# Patient Record
Sex: Female | Born: 1985 | Race: White | Hispanic: Yes | Smoking: Never smoker
Health system: Southern US, Community
[De-identification: ages and names within clinical notes are randomized; demographics above are authoritative.]

## PROBLEM LIST (undated history)

## (undated) DIAGNOSIS — Z8632 Personal history of gestational diabetes: Secondary | ICD-10-CM

## (undated) DIAGNOSIS — O24419 Gestational diabetes mellitus in pregnancy, unspecified control: Secondary | ICD-10-CM

## (undated) HISTORY — DX: Personal history of gestational diabetes: Z86.32

---
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
## 2003-01-07 ENCOUNTER — Encounter: Admission: RE | Admit: 2003-01-07 | Discharge: 2003-01-07 | Payer: Self-pay | Admitting: Family Medicine

## 2003-01-14 ENCOUNTER — Encounter: Admission: RE | Admit: 2003-01-14 | Discharge: 2003-01-14 | Payer: Self-pay | Admitting: Family Medicine

## 2003-01-20 ENCOUNTER — Inpatient Hospital Stay (HOSPITAL_COMMUNITY): Admission: AD | Admit: 2003-01-20 | Discharge: 2003-01-23 | Payer: Self-pay | Admitting: *Deleted

## 2003-01-24 ENCOUNTER — Encounter: Admission: RE | Admit: 2003-01-24 | Discharge: 2003-02-23 | Payer: Self-pay | Admitting: *Deleted

## 2003-03-26 ENCOUNTER — Encounter: Admission: RE | Admit: 2003-03-26 | Discharge: 2003-04-25 | Payer: Self-pay | Admitting: *Deleted

## 2003-05-26 ENCOUNTER — Encounter: Admission: RE | Admit: 2003-05-26 | Discharge: 2003-06-25 | Payer: Self-pay | Admitting: *Deleted

## 2003-06-26 ENCOUNTER — Encounter: Admission: RE | Admit: 2003-06-26 | Discharge: 2003-07-26 | Payer: Self-pay | Admitting: *Deleted

## 2003-09-26 ENCOUNTER — Encounter: Admission: RE | Admit: 2003-09-26 | Discharge: 2003-09-26 | Payer: Self-pay | Admitting: Family Medicine

## 2004-01-30 ENCOUNTER — Ambulatory Visit: Payer: Self-pay | Admitting: Family Medicine

## 2004-02-09 ENCOUNTER — Ambulatory Visit: Payer: Self-pay | Admitting: Family Medicine

## 2004-04-05 ENCOUNTER — Ambulatory Visit: Payer: Self-pay | Admitting: Family Medicine

## 2006-04-17 ENCOUNTER — Ambulatory Visit: Payer: Self-pay | Admitting: Family Medicine

## 2006-04-25 ENCOUNTER — Ambulatory Visit: Payer: Self-pay | Admitting: Family Medicine

## 2006-04-28 ENCOUNTER — Ambulatory Visit (HOSPITAL_COMMUNITY): Admission: RE | Admit: 2006-04-28 | Discharge: 2006-04-28 | Payer: Self-pay | Admitting: Obstetrics & Gynecology

## 2006-05-26 ENCOUNTER — Ambulatory Visit (HOSPITAL_COMMUNITY): Admission: RE | Admit: 2006-05-26 | Discharge: 2006-05-26 | Payer: Self-pay | Admitting: Family Medicine

## 2006-05-27 ENCOUNTER — Ambulatory Visit: Payer: Self-pay | Admitting: Family Medicine

## 2006-07-01 ENCOUNTER — Ambulatory Visit: Payer: Self-pay | Admitting: Family Medicine

## 2006-07-04 ENCOUNTER — Encounter (INDEPENDENT_AMBULATORY_CARE_PROVIDER_SITE_OTHER): Payer: Self-pay | Admitting: *Deleted

## 2006-08-15 ENCOUNTER — Ambulatory Visit: Payer: Self-pay | Admitting: Family Medicine

## 2006-08-18 ENCOUNTER — Ambulatory Visit: Payer: Self-pay | Admitting: Family Medicine

## 2006-08-18 ENCOUNTER — Encounter (INDEPENDENT_AMBULATORY_CARE_PROVIDER_SITE_OTHER): Payer: Self-pay | Admitting: Family Medicine

## 2006-08-18 DIAGNOSIS — O99810 Abnormal glucose complicating pregnancy: Secondary | ICD-10-CM

## 2006-09-10 ENCOUNTER — Encounter: Admission: RE | Admit: 2006-09-10 | Discharge: 2006-09-17 | Payer: Self-pay | Admitting: Family Medicine

## 2006-09-16 ENCOUNTER — Ambulatory Visit: Payer: Self-pay | Admitting: Family Medicine

## 2006-09-17 ENCOUNTER — Encounter (INDEPENDENT_AMBULATORY_CARE_PROVIDER_SITE_OTHER): Payer: Self-pay | Admitting: Family Medicine

## 2006-09-18 ENCOUNTER — Telehealth (INDEPENDENT_AMBULATORY_CARE_PROVIDER_SITE_OTHER): Payer: Self-pay | Admitting: *Deleted

## 2006-10-01 ENCOUNTER — Encounter (INDEPENDENT_AMBULATORY_CARE_PROVIDER_SITE_OTHER): Payer: Self-pay | Admitting: Family Medicine

## 2006-10-01 ENCOUNTER — Ambulatory Visit: Payer: Self-pay | Admitting: Sports Medicine

## 2006-10-17 ENCOUNTER — Ambulatory Visit: Payer: Self-pay | Admitting: Family Medicine

## 2006-10-24 ENCOUNTER — Ambulatory Visit: Payer: Self-pay | Admitting: Family Medicine

## 2006-10-24 ENCOUNTER — Encounter (INDEPENDENT_AMBULATORY_CARE_PROVIDER_SITE_OTHER): Payer: Self-pay | Admitting: Family Medicine

## 2006-10-24 LAB — CONVERTED CEMR LAB
Alkaline Phosphatase: 235 units/L — ABNORMAL HIGH (ref 39–117)
BUN: 11 mg/dL (ref 6–23)
Glucose, Bld: 66 mg/dL — ABNORMAL LOW (ref 70–99)
Total Bilirubin: 0.5 mg/dL (ref 0.3–1.2)

## 2006-10-27 ENCOUNTER — Ambulatory Visit: Payer: Self-pay | Admitting: Sports Medicine

## 2006-10-28 ENCOUNTER — Ambulatory Visit: Payer: Self-pay | Admitting: Family Medicine

## 2006-10-29 ENCOUNTER — Inpatient Hospital Stay (HOSPITAL_COMMUNITY): Admission: AD | Admit: 2006-10-29 | Discharge: 2006-10-30 | Payer: Self-pay | Admitting: Obstetrics and Gynecology

## 2006-10-29 ENCOUNTER — Ambulatory Visit: Payer: Self-pay | Admitting: Obstetrics & Gynecology

## 2006-12-08 ENCOUNTER — Ambulatory Visit: Payer: Self-pay | Admitting: Sports Medicine

## 2006-12-08 ENCOUNTER — Encounter (INDEPENDENT_AMBULATORY_CARE_PROVIDER_SITE_OTHER): Payer: Self-pay | Admitting: Family Medicine

## 2006-12-08 LAB — CONVERTED CEMR LAB
BUN: 15 mg/dL (ref 6–23)
Chloride: 106 meq/L (ref 96–112)
Glucose, Bld: 87 mg/dL (ref 70–99)
Potassium: 3.8 meq/L (ref 3.5–5.3)

## 2007-01-07 ENCOUNTER — Ambulatory Visit: Payer: Self-pay | Admitting: Family Medicine

## 2007-02-04 ENCOUNTER — Ambulatory Visit: Payer: Self-pay | Admitting: Family Medicine

## 2007-02-04 ENCOUNTER — Encounter (INDEPENDENT_AMBULATORY_CARE_PROVIDER_SITE_OTHER): Payer: Self-pay | Admitting: Family Medicine

## 2007-02-04 LAB — CONVERTED CEMR LAB: Blood Glucose, Fingerstick: 111

## 2007-02-20 ENCOUNTER — Ambulatory Visit: Payer: Self-pay | Admitting: Internal Medicine

## 2007-03-19 ENCOUNTER — Ambulatory Visit: Payer: Self-pay | Admitting: Family Medicine

## 2007-05-21 ENCOUNTER — Ambulatory Visit: Payer: Self-pay | Admitting: Family Medicine

## 2007-08-25 ENCOUNTER — Ambulatory Visit: Payer: Self-pay | Admitting: Family Medicine

## 2007-08-25 LAB — CONVERTED CEMR LAB: Beta hcg, urine, semiquantitative: NEGATIVE

## 2007-10-14 ENCOUNTER — Ambulatory Visit: Payer: Self-pay | Admitting: Family Medicine

## 2007-10-14 ENCOUNTER — Encounter (INDEPENDENT_AMBULATORY_CARE_PROVIDER_SITE_OTHER): Payer: Self-pay | Admitting: Family Medicine

## 2007-10-14 DIAGNOSIS — S139XXA Sprain of joints and ligaments of unspecified parts of neck, initial encounter: Secondary | ICD-10-CM | POA: Insufficient documentation

## 2007-10-14 LAB — CONVERTED CEMR LAB
BUN: 12 mg/dL (ref 6–23)
Calcium: 9.5 mg/dL (ref 8.4–10.5)
Chlamydia, DNA Probe: NEGATIVE
Glucose, Bld: 81 mg/dL (ref 70–99)
Potassium: 4 meq/L (ref 3.5–5.3)

## 2007-10-20 LAB — CONVERTED CEMR LAB: Pap Smear: NORMAL

## 2007-11-11 ENCOUNTER — Ambulatory Visit: Payer: Self-pay | Admitting: Family Medicine

## 2007-12-07 IMAGING — US US OB COMP +14 WK
1 series · 18 of 28 positions shown · non-contrast
Comparison: none

CLINICAL DATA: 16 week 2 day gestational age by LMP although LMP is uncertain.  Evaluate dating and anatomy.

[Series 1: us ob comp less 14 wks · 18 of 32 slices shown]
[im 1/32]
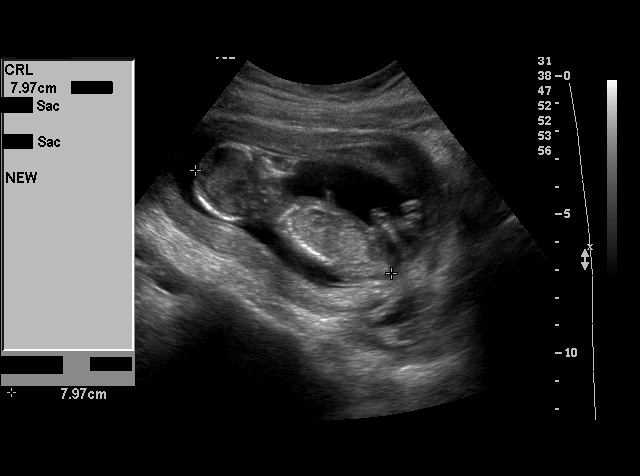
[im 3/32]
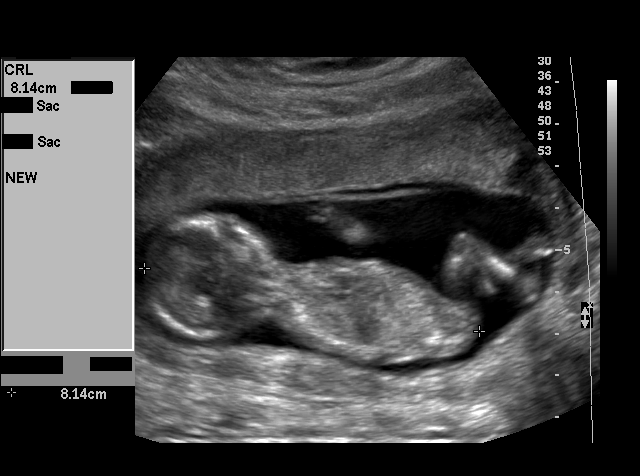
[im 4/32]
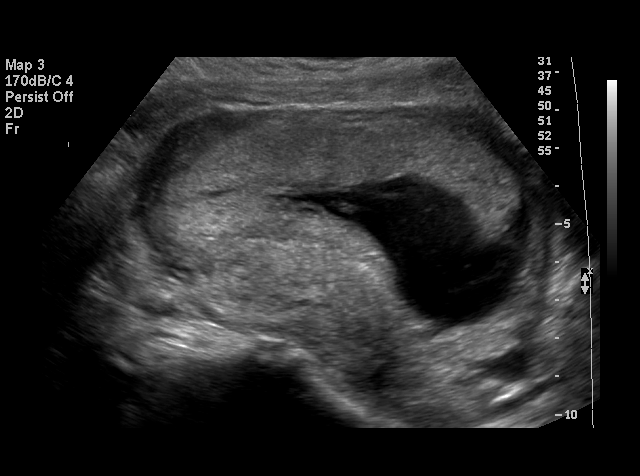
[im 6/32]
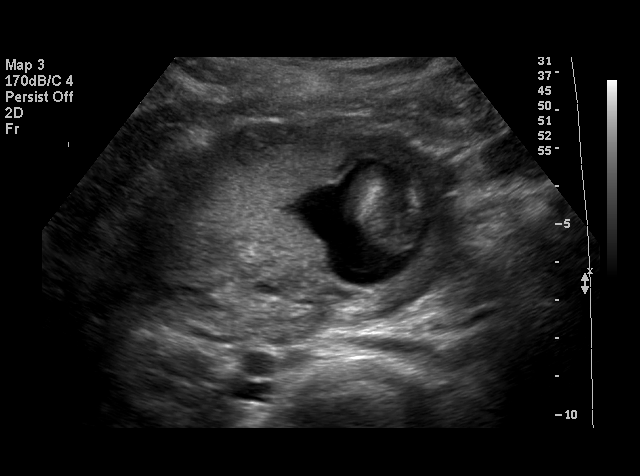
[im 9/32]
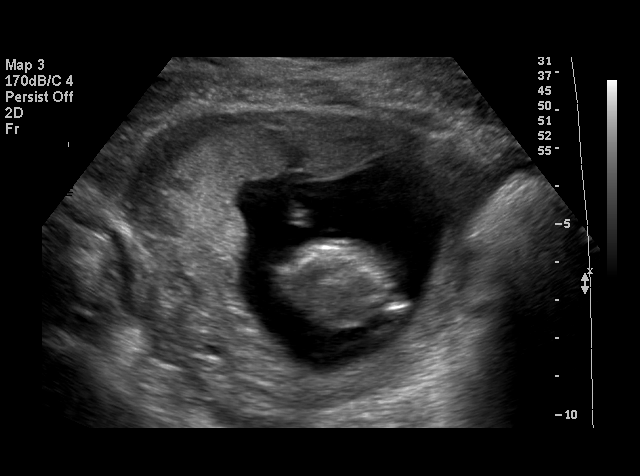
[im 10/32]
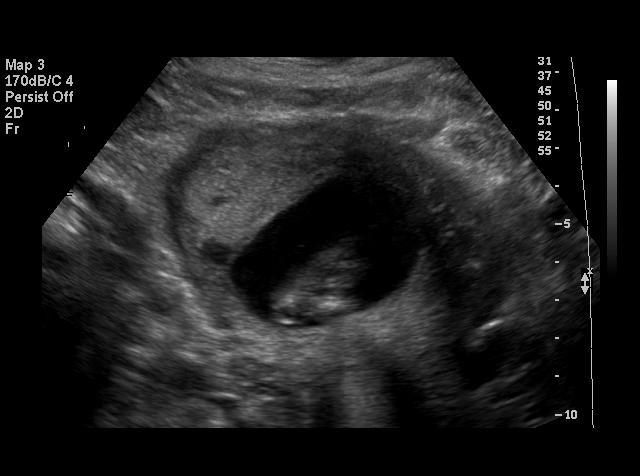
[im 12/32]
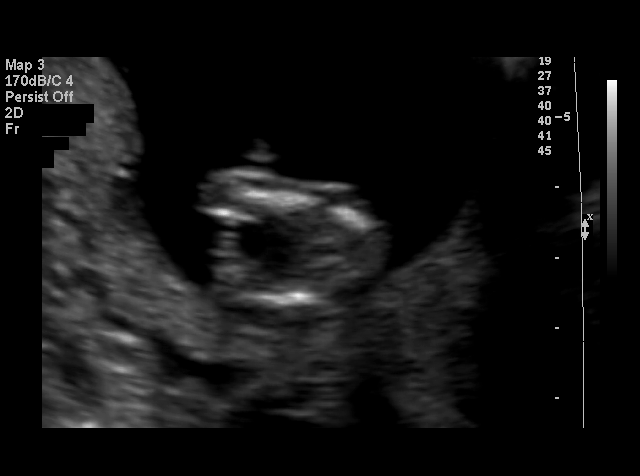
[im 13/32]
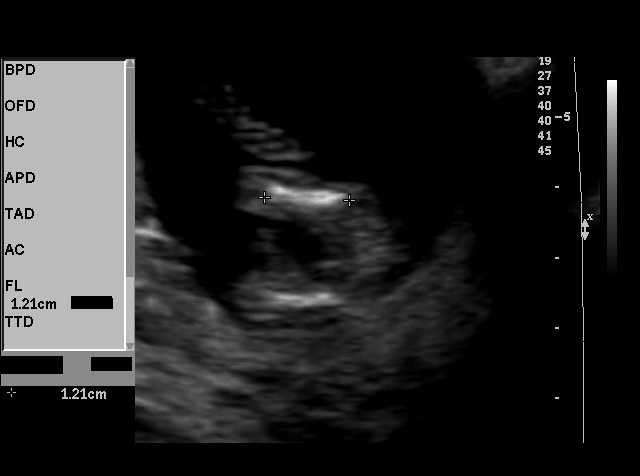
[im 15/32]
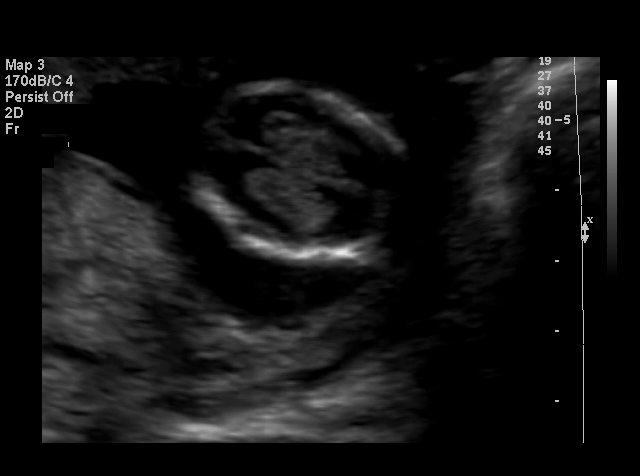
[im 17/32]
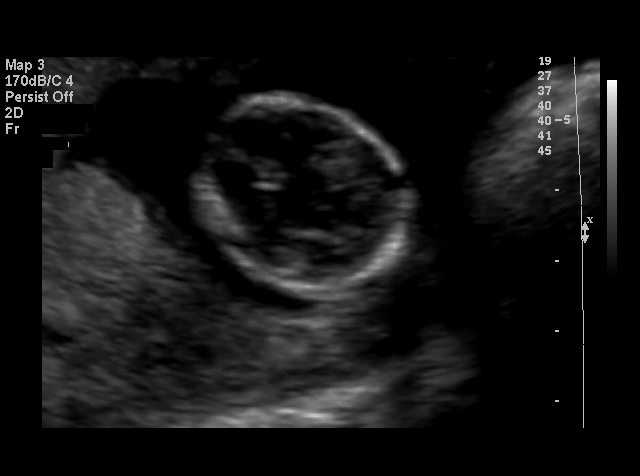
[im 19/32]
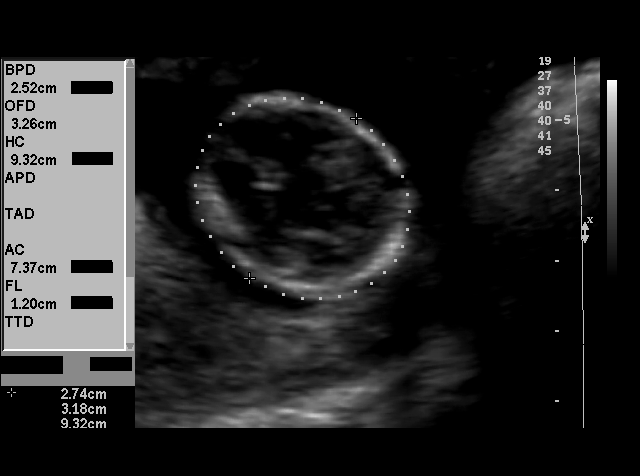
[im 20/32]
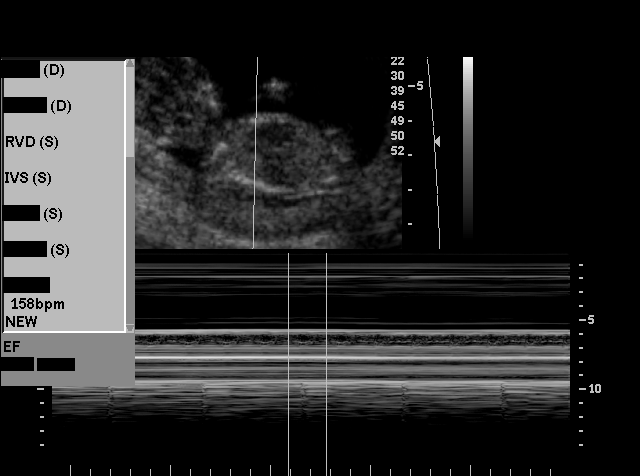
[im 22/32]
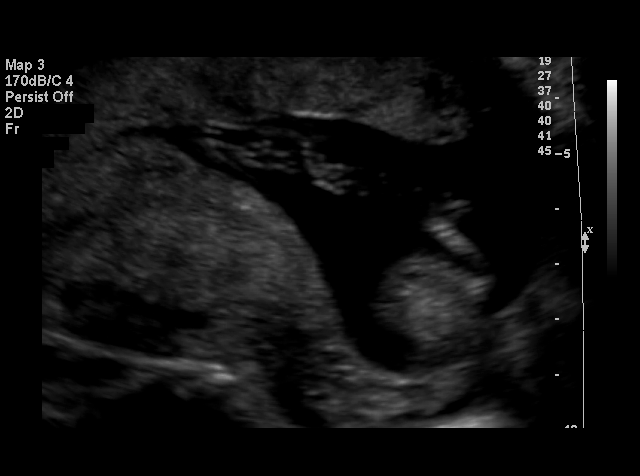
[im 25/32]
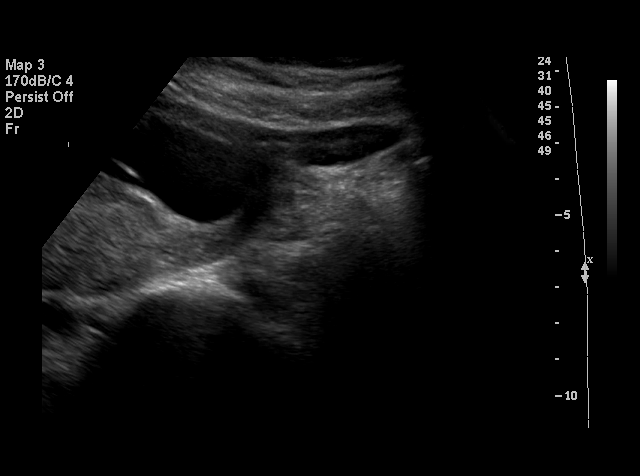
[im 26/32]
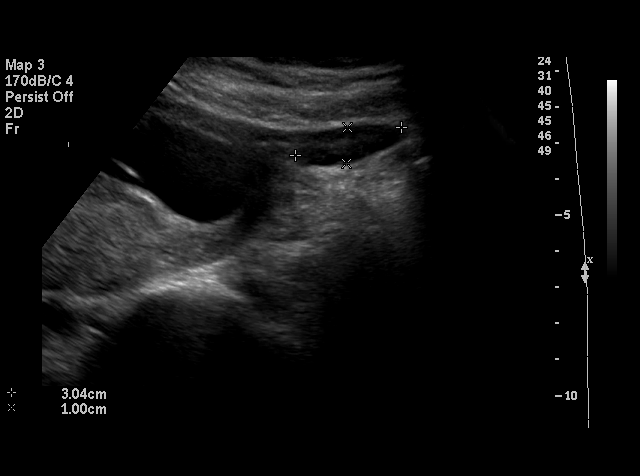
[im 28/32]
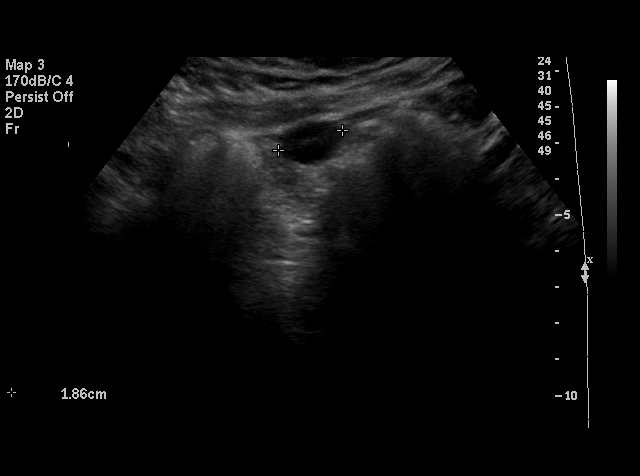
[im 29/32]
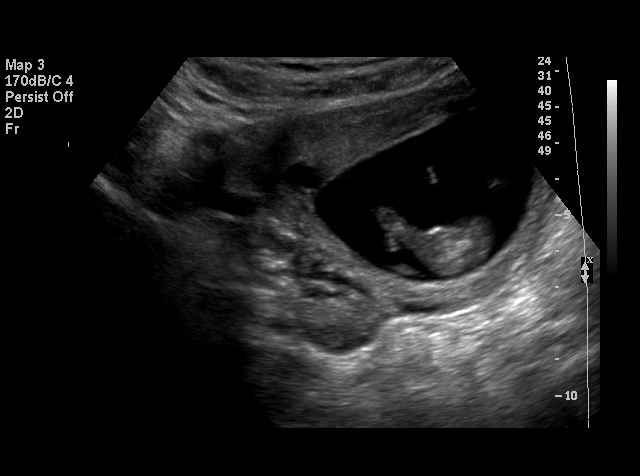
[im 32/32]
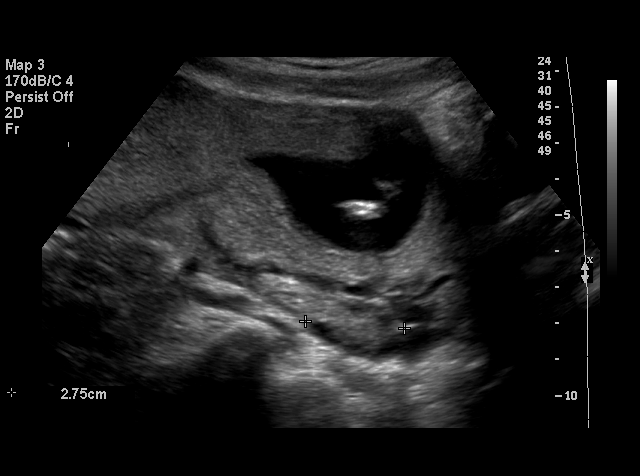

[18 of 28 positions shown; findings below may reference images not displayed]

OBSTETRICAL ULTRASOUND:
 Number of Fetuses:  1
 Heart Rate:  158 bpm
 Movement:  Yes
 Breathing:  No
 Presentation:  Variable
 Placental Location:  Left lateral
 Grade:  I
 Previa:  No
 Amniotic Fluid (Subjective):  Normal

 FETAL BIOMETRY
 CRL:  8.0 cm   14 w 0 d 
 BPD:  2.5 cm   14 w 3 d 
 HC:  9.3 cm   14 w 2 d 
 AC:  7.4 cm   14 w 0 d 
 FL:  1.2 cm   13 w 5 d 

 MEAN GA:  14 w 1 d   US EDC:  10/26/06

 FETAL ANATOMY
 Lateral Ventricles:  Choroid plexus visualized
 Thalami/CSP:  Not visualized 
 Posterior Fossa:  Not visualized 
 Nuchal Region:  Not visualized 
 Spine:  Not visualized 
 4 Chamber Heart on Left:  Not visualized 
 Stomach on Left:  Visualized 
 3 Vessel Cord:  Not visualized 
 Cord Insertion site:  Not visualized 
 Kidneys:  Not visualized 
 Bladder:  Visualized 
 Extremities:  Not visualized 

 Evaluation limited by:  Early gestational age.  

 MATERNAL UTERINE AND ADNEXAL FINDINGS
 Cervix:  Closed.

 Both ovaries are normal on appearance.
IMPRESSION: 1.  Single living intrauterine fetus with mean gestational age of 14 weeks 1 day and US EDC of 10/26/06.  
 2.  Fetal anatomic evaluation could not be performed due to early gestational age; however, no early fetal abnormalities are identified.  Follow-up ultrasound for complete anatomic evaluation has been scheduled for patient convenience on 05/26/06.

## 2008-02-04 ENCOUNTER — Ambulatory Visit: Payer: Self-pay | Admitting: Family Medicine

## 2008-02-04 LAB — CONVERTED CEMR LAB
Bilirubin Urine: NEGATIVE
Glucose, Urine, Semiquant: NEGATIVE
Protein, U semiquant: NEGATIVE
Specific Gravity, Urine: 1.025
pH: 7

## 2008-05-16 ENCOUNTER — Ambulatory Visit: Payer: Self-pay | Admitting: Family Medicine

## 2008-05-23 ENCOUNTER — Ambulatory Visit: Payer: Self-pay | Admitting: Family Medicine

## 2008-06-13 ENCOUNTER — Ambulatory Visit: Payer: Self-pay | Admitting: Family Medicine

## 2008-08-19 ENCOUNTER — Ambulatory Visit: Payer: Self-pay | Admitting: Family Medicine

## 2010-09-21 NOTE — Discharge Summary (Signed)
[name], [name]                ACCOUNT NO.:  [id number]   MEDICAL RECORD NO.:  [id number]                   PATIENT TYPE:  INP   LOCATION:  9323                                 FACILITY:  [facility]   PHYSICIAN:  Maylon Peppers. Waynette Buttery, M.D.               DATE OF BIRTH:  1985/10/27   DATE OF ADMISSION:  01/20/2003  DATE OF DISCHARGE:                                 DISCHARGE SUMMARY   ATTENDING PHYSICIAN:  Dr. Penne Lash.   DIAGNOSES AT DISCHARGE:  1. Status post assisted vaginal delivery.  2. Status post HELLP (hemolysis, elevated liver enzymes, low platelets)     syndrome.   MEDICATIONS AT DISCHARGE:  1. Ibuprofen 600 mg p.o. q.6h. as needed.  2. Percocet one to two q.6h. as needed.  3. Lidocaine 5% ointment as needed.   DISCHARGE INSTRUCTIONS:  1. The patient was discharged with wound care per instruction booklet.  2. Diet is regular.  3. Activity per instruction booklet.  4. Sexual activity is nothing per vagina times six weeks.  5. Followup is at [facility] in six weeks.   HOSPITAL COURSE:  [name] is a 25 year old Hispanic female,  G1, now P1, who presented to MAU at [facility] at approximately 5 a.m.  She apparently had spontaneous rupture of membranes at 0140, was having  contractions, and complaining of right upper quadrant pain as well as hand  and face swelling.  The patient's blood pressure on admission was 164/103.  Please see admission H&P for further details.  The patient was admitted and  placed on magnesium sulfate drip and given an epidural and Pitocin to  augment labor.  At approximately 1728, delivered a female with Apgars of 9  and 9 at 1 and 5 minutes, respectively.  There was a single nuchal cord  which was loose and reduced at the perineum.  The infant was suctioned and  stimulated at the perineum.  The patient had bilateral sulcus and labial  tears as well as a first degree midline perineal tear which were repaired  with  [place] and chromic gut sutures, respectively.  The uterus was massaged  to firm and the patient continued to have some bleeding, therefore a  milligram of Cytotec was inserted into the rectum to assist with hemostasis,  given that the patient was felt to be in HELLP syndrome.  Hemostasis was  achieved.  Mother and baby were stable.  Estimated blood loss was 700 cubic  centimeters.  The mother was sent to the adult ICU secondary to being on a  magnesium sulfate drip.  The infant was sent to the newborn nursery where  she apparently had a couple of apneic episodes and was sent to the NICU.  The infant is still in the NICU at the time of discharge today.  The mother  had a  relatively benign postpartum course and was discharged from the [facility] on  March 24, 2003, after diuresing and was discharged from the  hospital on  the 19th with instructions to follow up at [facility] in six weeks when  the patient would like an IUD placed.                                               Maylon Peppers Waynette Buttery, M.D.    SAG/MEDQ  D:  01/23/2003  T:  01/23/2003  Job:  [id number]   cc:   Women's Health

## 2011-02-20 LAB — CBC
HCT: 41.8
Hemoglobin: 13.8
MCV: 84.9
Platelets: 108 — ABNORMAL LOW
RDW: 14.3 — ABNORMAL HIGH

## 2012-03-06 ENCOUNTER — Other Ambulatory Visit: Payer: Self-pay

## 2012-03-06 DIAGNOSIS — Z3480 Encounter for supervision of other normal pregnancy, unspecified trimester: Secondary | ICD-10-CM

## 2012-03-06 LAB — HIV ANTIBODY (ROUTINE TESTING W REFLEX): HIV: NONREACTIVE

## 2012-03-06 NOTE — Progress Notes (Signed)
PRENATAL LABS DONE TODAY Colleen Thornton 

## 2012-03-07 LAB — OBSTETRIC PANEL
Basophils Relative: 0 % (ref 0–1)
Eosinophils Absolute: 0.1 10*3/uL (ref 0.0–0.7)
Eosinophils Relative: 1 % (ref 0–5)
Hepatitis B Surface Ag: NEGATIVE
Lymphs Abs: 1.8 10*3/uL (ref 0.7–4.0)
MCH: 27.9 pg (ref 26.0–34.0)
MCHC: 35 g/dL (ref 30.0–36.0)
MCV: 79.9 fL (ref 78.0–100.0)
Monocytes Relative: 5 % (ref 3–12)
Neutrophils Relative %: 62 % (ref 43–77)
Platelets: 167 10*3/uL (ref 150–400)
RBC: 4.62 MIL/uL (ref 3.87–5.11)
Rh Type: POSITIVE

## 2012-03-07 LAB — SICKLE CELL SCREEN: Sickle Cell Screen: NEGATIVE

## 2012-03-09 LAB — CULTURE, OB URINE: Colony Count: 15000

## 2012-03-13 ENCOUNTER — Ambulatory Visit (INDEPENDENT_AMBULATORY_CARE_PROVIDER_SITE_OTHER): Payer: Self-pay | Admitting: Family Medicine

## 2012-03-13 ENCOUNTER — Encounter: Payer: Self-pay | Admitting: Family Medicine

## 2012-03-13 VITALS — BP 121/84 | Temp 98.4°F | Wt 130.0 lb

## 2012-03-13 DIAGNOSIS — Z3480 Encounter for supervision of other normal pregnancy, unspecified trimester: Secondary | ICD-10-CM

## 2012-03-13 DIAGNOSIS — Z3490 Encounter for supervision of normal pregnancy, unspecified, unspecified trimester: Secondary | ICD-10-CM

## 2012-03-13 DIAGNOSIS — Z23 Encounter for immunization: Secondary | ICD-10-CM

## 2012-03-13 HISTORY — DX: Encounter for supervision of normal pregnancy, unspecified, unspecified trimester: Z34.90

## 2012-03-13 MED ORDER — PRENATAL VITAMINS 28-0.8 MG PO TABS: 1.0000 | ORAL_TABLET | Freq: Every day | ORAL | Status: DC

## 2012-03-13 NOTE — Patient Instructions (Signed)
Nice to meet you today. You are doing great with your pregnancy. Please continue to exercise as you were previously. We will check a blood sugar level today to give [place] an idea about gestational diabetes.  Please stop at the front desk and ask about the orange card so you can get an ultrasound. I have given you a prescription for a prenatal vitamin with folate. I will see you back in 4 weeks for another prenatal visit. Thank you for your visit today.  Embarazo  Primer trimestre  (Pregnancy - First Trimester)  Durante el acto sexual, millones de espermatozoides entran en la vagina. Slo 1 espermatozoide penetra y fertiliza al vulo mientras se encuentra en la trompa de [place]. Una semana ms tarde, el vulo fertilizado se implanta en la pared del tero. Un embrin comienza a desarrollarse para ser un beb. A las 6 a 8 semanas se forman los ojos y la cara y los latidos del corazn se pueden ver en la ecografa. Al final de las 12 semanas (primer trimestre) todos los rganos del beb estn formados. Ahora que est embarazada, querr hacer todo lo que est a su alcance para tener un beb sano. Dos de las cosas ms importantes son: Colleen Thornton buena atencin prenatal y seguir las indicaciones del profesional que la asiste. La atencin prenatal incluye toda la asistencia mdica que usted recibe antes del nacimiento del beb. Se lleva a cabo para prevenir y tratar problemas durante el [address] y [employer]. EXAMENES PRENATALES   Durante las visitas prenatales se [employer], la presin arterial y se solicitan anlisis de [place]. Esto se hace para asegurarse de que usted est sana y el embarazo progrese normalmente.  Una mujer embarazada debe aumentar de 25 a 35 libras durante el embarazo. Sin embargo, si usted tiene sobrepeso o [place], su mdico Administrator, Civil Service.  El podr hacerle preguntas y responder todas las que usted le haga.  Durante los exmenes prenatales se solicitan anlisis de  [place], cultivos del [place], un [place] y otros anlisis necesarios. Estas pruebas se realizan para controlar su salud y la del beb. Se recomienda que se haga la prueba para el diagnstico del VIH, con su autorizacin. Este es el virus que causa el [place]. Estas pruebas se realizan porque existen medicamentos que podran administrarle para prevenir que el beb nazca con esta infeccin, si usted estuviera infectada y no lo supiera. Los [employer] Corporation de sangre tambin se Radiographer, therapeutic para Warehouse manager tipo de [place], si tuvo infecciones previas y Chief Operating Officer sus niveles en la sangre (hemoglobina).  Tener un recuento bajo de hemoglobina (anemia) es comn durante Firefighter. Para prevenirla, se administran hierro y vitaminas. En una etapa ms avanzada del [place], le indicarn exmenes de sangre para saber si tiene diabetes, junto con otros anlisis, en caso de que [place]. Es necesario que se haga las pruebas para asegurarse de que usted y el beb estn bien.  Es posible que necesite otras pruebas adicionales. CAMBIOS DURANTE EL PRIMER TRIMESTRE (LOS TRES PRIMEROS MESES DEL EMBARAZO).  Su organismo atravesar numerosos cambios Academic librarian. Estos pueden variar de Neomia Dear persona a otra. Converse con el mdico acerca los cambios que usted nota y que la preocupan. Ellos son:   El perodo menstrual se detiene.  El vulo y los espermatozoides llevan los genes que determinan cmo seremos. Sus genes y los de su pareja forman el beb. Los genes del varn determinan si ser un nio o una nia.  La  circunferencia de la cintura va a ir aumentando y podr sentirse hinchada.  Puede tener Programme researcher, broadcasting/film/video (nuseas) y vmitos. Si no puede controlar los vmitos, consulte a su mdico.  Sus mamas comenzarn a agrandarse y sensibilizarse.  Los pezones pueden sobresalir ms y ser ms oscuros.  Tendr necesidad de orinar ms. El dolor al orinar puede significar que usted tiene una infeccin de la vejiga.  Se  cansar con facilidad.  Prdida del apetito.  Sentir un fuerte deseo de consumir ciertos alimentos.  Al principio, usted puede ganar o perder un par de kilos.  Podr tener cambios emocionales de un da a otro (entusiasmo por estar embarazada o preocupacin por Firefighter y el beb).  Tendr sueos ms vvidos y extraos. INSTRUCCIONES PARA EL CUIDADO EN EL HOGAR   Es muy importante evitar el cigarrillo, el alcohol y los frmacos no recetados Academic librarian. Estas sustancias afectan la formacin y el desarrollo del beb. Evite los productos qumicos durante el embarazo para Games developer salud del beb.  Comience las consultas prenatales alrededor de la 12 semana de [place]. Generalmente se programan cada mes al principio y se hacen ms frecuentes en los 2 ltimos meses antes del parto. Cumpla con las citas de control. Siga las indicaciones del mdico con respecto al uso de [place], los anlisis y pruebas de [place], los ejercicios y Psychologist, forensic.  Durante el embarazo debe obtener nutrientes para usted y para su beb. Consuma alimentos balanceados. Elija alimentos como carne, pescado, [place] y otros productos lcteos descremados, vegetales, frutas, panes integrales y cereales. El [employer]ot informar cul es el aumento de peso ideal.  Las nuseas matinales pueden aliviarse si come algunas galletitas saladas en la cama. Coma dos galletitas antes de levantarse por la maana. Tambin puede comer galletitas sin sal.  Hacer 4 o 5 comidas pequeas en lugar de 3 comidas grandes por da tambin puede [employer] nuseas y los vmitos.  Beber lquidos [place] comidas en lugar de tomarlos durante las comidas tambin puede ayudar a Optician, dispensing las nuseas y los vmitos.  Puede continuar teniendo [employer] durante todo el embarazo si no hay otros problemas. Los problemas pueden ser una prdida precoz (prematura) de lquido amnitico, sangrado vaginal, o dolor en el vientre  (abdominal).  Realice Tesoro Corporation, si no tiene restricciones. Consulte con su mdico o terapeuta fsico si no est segura de algunos de sus ejercicios. El mayor aumento de peso se producir en los ltimos 2 trimestres del Psychiatrist. El ejercicio le ayudar a:  Engineering geologist.  Mantenerse en forma.  Prepararse para el parto.  La ayudar a perder el peso del embarazo despus de que nazca su beb.  Use un buen sostn o como los que se usan para hacer deportes para Paramedic la sensibilidad de las Page. Tambin puede serle til si lo [place] mientras duerme.  Consulte cuando puede comenzar con las clases de pre parto. Comience con las clases cuando estn disponibles.  No utilice la baera con agua caliente, baos turcos y saunas.  Colquese el cinturn de seguridad cuando conduzca. Este la proteger a usted y al beb en caso de accidente.  Evite comer carne cruda y el contacto con los utensilios y desperdicios de los gatos. Estos elementos contienen grmenes que pueden causar defectos de nacimiento en el beb.  El primer trimestre es un buen momento para visitar a su dentista y Software engineer. Es importante mantener los dientes limpios. Use un cepillo  de dientes suave y cepllese con ms suavidad durante el [employer].  Pida ayuda si tienen necesidades financieras, teraputicas o nutricionales. El profesional podr ayudarla con respecto a estas necesidades, o derivarla a otros especialistas.  No tome medicamentos o hierbas excepto aquellos que Fish farm manager.  Informe a su mdico si sufre violencia familiar mental o fsica.  Haga una lista de nmeros de telfono de Associate Professor de la familia, los amigos, el hospital y los departamentos de polica y bomberos.  Escriba sus preguntas. Llvelas cuando concurra a su visita prenatal.  No se haga duchas vaginales.  No cruce las piernas.  Si usted tiene que estar parada por largos perodos de [place], gire los  pies o de pequeos pasos en crculo.  Es posible que tenga ms secreciones vaginales que puedan requerir una toalla higinica. No use tampones o toallas higinicas perfumadas. EL CONSUMO DE MEDICAMENTOS Y FRMACOS DURANTE EL EMBARAZO   Tome las vitaminas para la etapa prenatal tal como se le indic. Las vitaminas deben contener un miligramo de cido flico. Guarde todas las vitaminas fuera del alcance de los nios. La ingestin de slo un par de vitaminas o tabletas que contengan hierro pueden ocasionar la Newmont Mining en un beb o en un nio pequeo.  Evite el uso de The Mutual of Omaha, incluyendo hierbas, medicamentos de [place], sin receta o que no hayan sido sugeridos por su mdico. Slo tome medicamentos de venta libre o medicamentos recetados para Chief Technology Officer, Environmental health practitioner o fiebre como lo indique su mdico. No tome aspirina, ibuprofeno o naproxeno excepto que su mdico se lo indique.  Infrmele al profesional si consume medicamentos de hierbas.  El alcohol se relaciona con ciertos defectos congnitos. Incluye el sndrome de alcoholismo fetal. Debe evitar absolutamente el consumo de alcohol, en cualquier forma. El fumar causar baja tasa de natalidad y bebs prematuros.  Las drogas ilegales o de la calle son muy perjudiciales para el beb. Estn absolutamente prohibidas. Un beb que nace de American Express, ser adicto al nacer. Ese beb tendr los mismos sntomas de abstinencia que un adulto.  Informe a su mdico acerca de los medicamentos que ha tomado y el motivo por el que los tom. EL ABORTO ESPONTNEO ES COMN DURANTE EL EMBARAZO  Esto no significa que haya hecho algo mal. No es un motivo para preocuparse en caso de un nuevo embarazo. El profesional que la asiste la ayudar si tiene preguntas para formular. Si tiene un aborto espontneo podr requerir Futures trader.  SOLICITE ATENCIN MDICA SI:  Tiene preguntas o preocupaciones relacionadas con el embarazo. Es mejor que llame para  formular las preguntas si no puede esperar hasta la prxima visita, que sentirse preocupada por ellas.  SOLICITE ATENCIN MDICA DE INMEDIATO SI:   La temperatura oral le sube a ms de 102 F (38.9 C) o lo que su mdico le indique.  Tiene una prdida de lquido por la vagina (canal de parto). Si sospecha una ruptura de las [place], tmese la temperatura y llame al profesional para informarlo sobre esto.  Observa unas pequeas manchas o una hemorragia vaginal. Notifique al profesional acerca de la cantidad y de cuntos apsitos est utilizando.  Presenta un olor desagradable en la secrecin vaginal y observa un cambio en el color.  Contina con las nuseas y no obtiene alivio de los remedios indicados. Vomita sangre o algo similar a la borra del caf.  Pierde ms de 2 libras (1 Kg) en una semana.  Aumenta ms de  1 Kg en una semana y [address], las manos, los pies o las piernas hinchados.  Aumenta ms de 2,5 Kg en una semana (aunque no tenga las manos, pies, piernas o el rostro hinchados).  Ha estado expuesta a la rubola y no ha sufrido la enfermedad.  Ha estado expuesta a la quinta enfermedad o a la varicela.  Siente dolor en el vientre (abdominal). Las [employer] en el ligamento redondo son Neomia Dear causa benigna frecuente de dolor abdominal durante el embarazo. El profesional que la asiste deber evaluarla.  Presenta dolor de Renne Musca, diarrea, dolor al orinar o le falta la respiracin.  Se cae, se ve involucrada en un accidente automovilstico o sufre algn tipo de traumatismo.  En su hogar hay violencia mental o fsica. Document Released: 01/30/2005 Document Revised: 10/22/2011 [facility] Patient Information 2013 [place], [place].

## 2012-03-13 NOTE — Progress Notes (Signed)
HPI: Patient seen for first OB visit. Complaint of mild nausea that started after she found out she was pregnant.  This has been improving.  Also complaint of sharp LLQ pain that comes and goes and has occurred over the past 1-2 weeks only when laying down.  Denies vaginal bleeding and weight gain.    PE: Gen: alert, no acute distress, vital signs stable  CV: regular rate and rhythm, no murmurs, rubs or gallops  Pulm: CTAB, no wheezes or crackles  Abd: soft, non-tender throughout, non-distended, +bowel sounds, no masses palpated, unable to palpate the uterine fundus  Fetal heart tones heard with rate of 149   A/P:  1. Prenatal care: will plan to see back in 4 weeks for next prenatal visit. Early one hour glucola today given history of GDM with last pregnancy.  Has history of post-partum preeclampsia, will continue to watch BP throughout pregnancy. Given flu shot today. Started on prenatal vitamin with folate. Will plan to discuss genetic screening at next visit with plan for quad screening.  Patient to apply for orange card for assistance with [place]. Advised on exercise and weight gain during pregnancy.

## 2012-04-10 ENCOUNTER — Ambulatory Visit (INDEPENDENT_AMBULATORY_CARE_PROVIDER_SITE_OTHER): Payer: Self-pay | Admitting: Family Medicine

## 2012-04-10 VITALS — BP 106/68 | Temp 99.1°F | Wt 134.0 lb

## 2012-04-10 DIAGNOSIS — Z331 Pregnant state, incidental: Secondary | ICD-10-CM

## 2012-04-10 DIAGNOSIS — Z3490 Encounter for supervision of normal pregnancy, unspecified, unspecified trimester: Secondary | ICD-10-CM

## 2012-04-10 MED ORDER — RANITIDINE HCL 150 MG PO TABS: 150.0000 mg | ORAL_TABLET | Freq: Two times a day (BID) | ORAL | Status: DC

## 2012-04-10 NOTE — Progress Notes (Signed)
Colleen Thornton is a 26 y.o. G3P2002 at [redacted]w[redacted]d for routine follow up.  She reports heartburn, nausea, no bleeding, no contractions, no cramping, no leaking and vomiting . Heart burn occurs at night only when laying down, had this with previous pregnancy and was treated with zantac. Nausea occurs in the morning and occasionally occurs with vomiting. Has been able to maintain normal food and liquid intake.  PE: well appearing, no acute distress Abd: soft, non-tender, non-distended Fetal heart tones heard, rate 152  A/P: Pregnancy at [redacted]w[redacted]d.  Doing well.   Heartburn: will treat with zantac as this was tolerated in previous pregnancy. Will continue to follow nausea and vomiting. At this time she continues to tolerate food and liquids so will follow this issue. Patient in process of applying for orange card, once this is obtained will order anatomy US. Pt  is interested in genetic screening. And is to return next week for quad screen lab draw. Will schedule in OB clinic at next visit. Bleeding and pain precautions reviewed. Follow up 4 weeks.

## 2012-04-10 NOTE — Patient Instructions (Addendum)
Nice to see you again. We will obtain some blood work to determine the risk for down syndrome. For your heart burn please take the zantac as prescribed.  Embarazo - Segundo trimestre (Pregnancy - Second Trimester) El segundo trimestre del Psychiatrist (del 3 al ) es un perodo de evolucin rpida para usted y el beb. Hacia el final del sexto mes, el beb mide aproximadamente 23 cm y pesa 680 g. Comenzar a Pharmacologist del beb [place] 18 y las 20 [address] de [place]. Podr sentir las pataditas ("quickening en ingls"). Hay un rpido [employer]. Puede segregar un lquido claro Charity fundraiser) de las [place]. Quizs sienta pequeas contracciones en el vientre (tero) Esto se conoce como falso trabajo de parto o contracciones de [place]. Es como una prctica del trabajo de parto que se produce cuando el beb est listo para salir. Generalmente los problemas de vmitos matinales ya se han superado hacia el final del Medical sales representative. Algunas mujeres desarrollan pequeas manchas oscuras (que se denominan cloasma, mscara del embarazo) en la cara que normalmente se van luego del nacimiento del beb. La exposicin al sol empeora las manchas. Puede desarrollarse acn en algunas mujeres embarazadas, y puede desaparecer en aquellas que ya tienen acn. EXAMENES PRENATALES  Durante los [employer], deber seguir realizando pruebas de [place], segn avance el [place]. Estas pruebas se realizan para controlar su salud y la del beb. Tambin se realizan anlisis de sangre para [employer] niveles de [place]. La anemia (bajo nivel de hemoglobina) es frecuente durante el embarazo. Para prevenirla, se administran hierro y vitaminas. Tambin se le realizarn exmenes para saber si tiene diabetes entre las 24 y las 28 semanas del [place]. Podrn repetirle algunas de las [employer] hicieron previamente.  En cada visita le medirn el tamao del tero. Esto se realiza para asegurarse de que  el beb est creciendo correctamente de acuerdo al estado del [place].  Tambin en cada visita prenatal controlarn su presin arterial. Esto se realiza para asegurarse de que no tenga toxemia.  Se controlar su orina para asegurarse de que no tenga infecciones, diabetes o protena en la orina.  Se controlar su peso regularmente para asegurarse que el aumento ocurre al ritmo indicado. Esto se hace para asegurarse que usted y el beb tienen una evolucin normal.  En algunas ocasiones se realiza una prueba de ultrasonido para confirmar el correcto desarrollo y evolucin del beb. Esta prueba se realiza con ondas sonoras inofensivas para el beb, de modo que el profesional pueda calcular ms precisamente la fecha del [place]. Algunas veces se realizan pruebas especializadas del lquido amnitico que rodea al beb. Esta prueba se denomina amniocentesis. El lquido amnitico se obtiene introduciendo una aguja en el vientre (abdomen). Se realiza para Conservator, museum/gallery en los que existe alguna preocupacin acerca de algn problema gentico que pueda sufrir el beb. En ocasiones se lleva a cabo cerca del final del embarazo, si es necesario inducir al [employer]. En este caso se realiza para asegurarse que los pulmones del beb estn lo suficientemente maduros como para que pueda vivir fuera del tero. CAMBIOS QUE OCURREN EN EL SEGUNDO TRIMESTRE DEL EMBARAZO Su organismo atravesar numerosos cambios durante el [employer]. Estos pueden variar de Neomia Dear persona a otra. [place] con el profesional que la asiste acerca los cambios que usted note y que la preocupen.  Durante el segundo trimestre probablemente sienta un aumento del apetito. Es normal tener "antojos" de Development worker, community. Esto vara de Neomia Dear  persona a [place] y de un embarazo a Therapist, art.  El abdomen inferior comenzar a abultarse.  Podr tener la necesidad de Geographical information systems officer con ms frecuencia debido a que el tero y el beb presionan sobre la vejiga.  Tambin es frecuente contraer ms infecciones urinarias durante el embarazo (dolor al [employer]). Puede evitarlas bebiendo gran cantidad de lquidos y vaciando la vejiga antes y despus de Sales promotion account executive.  Podrn aparecer las primeras estras en las caderas, abdomen y [place]. Estos son cambios normales del cuerpo durante el [place]. No existen medicamentos ni ejercicios que puedan prevenir [employer].  Es posible que comience a desarrollar venas inflamadas y abultadas (varices) en las piernas. El uso de medias de descanso, Optometrist sus pies durante 15 minutos, 3 a 4 veces al da y Film/video editor la sal en su dieta ayuda a Journalist, newspaper.  Podr sentir Engineering geologist gstrica a medida que el tero crece y Doctor, general practice. Puede tomar anticidos, con la autorizacin de su mdico, para Financial planner. Tambin es til ingerir pequeas comidas 4 a 5 veces al Futures trader.  La constipacin puede tratarse con un laxante o agregando fibra a su dieta. Beber grandes cantidades de lquidos, comer vegetales, frutas y granos integrales es de [place].  Tambin es beneficioso practicar actividad fsica. Si ha sido una persona Engineer, mining, podr continuar con la [employer] de las actividades durante el mismo. Si ha sido [employer], puede ser beneficioso que comience con un programa de ejercicios, Museum/gallery exhibitions officer.  Puede desarrollar hemorroides (vrices en el recto) hacia el final del segundo trimestre. Tomar baos de asiento tibios y Chemical engineer cremas recomendadas por el profesional que lo asiste sern de ayuda para los problemas de hemorroides.  Tambin podr Financial risk analyst de espalda durante este momento de su embarazo. Evite levantar objetos pesados, utilice zapatos de taco bajo y [place] buena postura para ayudar a reducir los problemas de [place].  Algunas mujeres embarazadas desarrollan hormigueo y adormecimiento de la mano y los dedos debido a la hinchazn y compresin de  los ligamentos de la mueca (sndrome del tnel carpiano). Esto desaparece una vez que el beb nace.  Como sus pechos se agrandan, Pension scheme manager un sujetador ms grande. Use un sostn de soporte, cmodo y de algodn. No utilice un sostn para amamantar hasta el ltimo mes de embarazo si va a amamantar al beb.  Podr observar una lnea oscura desde el ombligo hacia la zona pbica denominada linea nigra.  Podr observar que sus mejillas se ponen coloradas debido al aumento de flujo sanguneo en la cara.  Podr desarrollar "araitas" en la cara, cuello y pecho. Esto desaparece una vez que el beb nace. INSTRUCCIONES PARA EL CUIDADO DOMICILIARIO  Es extremadamente importante que evite el cigarrillo, hierbas medicinales, alcohol y las drogas no prescriptas durante el Psychiatrist. Estas sustancias qumicas afectan la formacin y el desarrollo del beb. Evite estas sustancias durante todo el embarazo para asegurar el nacimiento de un beb sano.  La mayor parte de los cuidados que se aconsejan son los mismos que los indicados para Financial risk analyst trimestre del Psychiatrist. Cumpla con las citas tal como se le indic. Siga las instrucciones del profesional que lo asiste con respecto al uso de los medicamentos, el ejercicio y Psychologist, forensic.  Durante el embarazo debe obtener nutrientes para usted y para su beb. Consuma alimentos balanceados a intervalos regulares. Elija alimentos como carne, pescado, [place] y otros productos lcteos descremados, vegetales, frutas, panes integrales y cereales. El  profesional le informar cul es el aumento de peso ideal.  Las relaciones sexuales fsicas pueden continuarse hasta cerca del fin del embarazo si no existen otros problemas. Estos problemas pueden ser la prdida temprana (prematura) de lquido amnitico de las [place], sangrado vaginal, dolor abdominal u otros problemas mdicos o del Psychiatrist.  Realice [employer], si no tiene restricciones. Consulte con el  profesional que la asiste si no sabe con certeza si determinados ejercicios son seguros. El mayor aumento de peso tiene Environmental consultant durante los ltimos 2 trimestres del Psychiatrist. El ejercicio la ayudar a:  Engineering geologist.  Ponerla en forma para el parto.  Ayudarla a perder peso luego de haber dado a luz.  Use un buen sostn o como los que se usan para hacer deportes para Paramedic la sensibilidad de las [place]. Tambin puede serle til si lo [place] mientras duerme. Si pierde Product manager, podr [employer].  No utilice la baera con agua caliente, baos turcos y saunas durante el [address]  Utilice el cinturn de seguridad sin excepcin cuando conduzca. Este la proteger a usted y al beb en caso de accidente.  Evite comer carne cruda, queso crudo, y el contacto con los utensilios y desperdicios de los gatos. Estos elementos contienen grmenes que pueden causar defectos de nacimiento en el beb.  El segundo trimestre es un buen momento para visitar a su dentista y Software engineer si an no lo ha hecho. Es Primary school teacher los dientes limpios. Utilice un cepillo de dientes blando. Cepllese ms suavemente durante el embarazo.  Es ms fcil perder algo de orina durante el [place]. Apretar y Chief Operating Officer los msculos de la pelvis la ayudar con este problema. Practique detener la miccin cuando est en el bao. Estos son los mismos msculos que Development worker, international aid. Son [employer] mismos msculos que utiliza cuando trata de [employer] gases. Puede practicar apretando estos msculos 10 veces, y repetir esto 3 veces por da aproximadamente. Una vez que conozca qu msculos debe apretar, no realice estos ejercicios durante la miccin. Puede favorecerle una infeccin si la orina vuelve hacia atrs.  Pida ayuda si tiene necesidades econmicas, de asesoramiento o nutricionales durante el [place]. El profesional podr ayudarla con respecto a estas necesidades, o derivarla a otros  especialistas.  La piel puede ponerse grasa. Si esto sucede, lvese la cara con un jabn [name], utilice un humectante no graso y [place] con base de aceite o crema. CONSUMO DE MEDICAMENTOS Y DROGAS DURANTE EL EMBARAZO  Contine tomando las vitaminas apropiadas para esta etapa tal como se le indic. Las vitaminas deben contener un miligramo de cido flico y deben suplementarse con hierro. Guarde todas las vitaminas fuera del alcance de los nios. La ingestin de slo un par de vitaminas o tabletas que contengan hierro puede ocasionar la [employer] en un beb o en un nio pequeo.  Evite el uso de [place], inclusive los de venta libre y hierbas que no hayan sido prescriptos o indicados por el profesional que la asiste. Algunos medicamentos pueden causar problemas fsicos al beb. Utilice los medicamentos de venta libre o de prescripcin para Chief Technology Officer, Environmental health practitioner o la [place], segn se lo indique el profesional que lo asiste. No utilice aspirina.  El consumo de alcohol est relacionado con ciertos defectos de nacimiento. Esto incluye el sndrome de alcoholismo fetal. Debe evitar el consumo de alcohol en cualquiera de sus formas. El cigarrillo causa nacimientos prematuros y bebs de [place]. El [place]  de drogas recreativas est absolutamente prohibido. Son muy nocivas para el beb. Un beb que nace de [employer], ser adicto al nacer. Ese beb tendr los mismos sntomas de abstinencia que un adulto.  Infrmele al profesional si consume alguna droga.  No consuma drogas ilegales. Pueden causarle mucho dao al beb. SOLICITE ATENCIN MDICA SI: Tiene preguntas o preocupaciones durante su embarazo. Es mejor que llame para Science writer las dudas que esperar hasta su prxima visita prenatal. Thressa Sheller forma se sentir ms tranquila.  SOLICITE ATENCIN MDICA DE INMEDIATO SI:  La temperatura oral se eleva sin motivo por encima de 102 F (38.9 C) o segn le indique el profesional que lo asiste.  Tiene  una prdida de lquido por la vagina (canal de parto). Si sospecha una ruptura de las [place], tmese la temperatura y llame al profesional para informarlo sobre esto.  Observa unas pequeas manchas, una hemorragia vaginal o elimina cogulos. Notifique al profesional acerca de la cantidad y de cuntos apsitos est utilizando. Unas pequeas manchas de sangre son algo comn durante el Psychiatrist, especialmente despus de Sales promotion account executive.  Presenta un olor desagradable en la secrecin vaginal y observa un cambio en el color, de transparente a blanco.  Contina con las nuseas y no obtiene alivio de los remedios indicados. Vomita sangre o algo similar a la borra del caf.  [place] o sube ms de 900 g. en una semana, o segn lo indicado por el profesional que la asiste.  Observa que se le [employer], las manos, los pies o las piernas.  Ha estado expuesta a la rubola y no ha sufrido la enfermedad.  Ha estado expuesta a la quinta enfermedad o a la varicela.  Presenta dolor abdominal. Las molestias en el ligamento redondo son Neomia Dear causa no cancerosa (benigna) frecuente de dolor abdominal durante el embarazo. El profesional que la asiste deber evaluarla.  Presenta dolor de cabeza intenso que no se [place].  Presenta fiebre, diarrea, dolor al orinar o le falta la respiracin.  Presenta dificultad para ver, visin borrosa, o visin doble.  Sufre una cada, un accidente de trnsito o cualquier tipo de trauma.  Vive en un hogar en el que existe violencia fsica o mental. Document Released: 01/30/2005 Document Revised: 07/15/2011 [facility] Patient Information 2013 [place], [place].

## 2012-04-13 NOTE — Progress Notes (Signed)
Note reviewed.  Agree with plan.  Issues noted: H/o GDM.  Early glucola normal.  Counseled on weight gain and nutrition.  Needs repeat glucola 24-28 weeks. Due for pap, gc.cz screening.

## 2012-04-15 ENCOUNTER — Other Ambulatory Visit: Payer: Self-pay

## 2012-04-15 DIAGNOSIS — Z3490 Encounter for supervision of normal pregnancy, unspecified, unspecified trimester: Secondary | ICD-10-CM

## 2012-04-15 NOTE — Progress Notes (Signed)
QUAD SCREEN DONE TODAY Teva Bronkema

## 2012-05-01 ENCOUNTER — Encounter: Payer: Self-pay | Admitting: Family Medicine

## 2012-05-06 NOTE — L&D Delivery Note (Signed)
Delivery Note At  a viable female was delivered via  (Presentation:ROA ;  ).  APGAR:7 ,9 ; weight pending.   Placenta status: spontaneous, in tact.  Cord: 3-vessel  with the following complications: None.  Cord pH: Deferred Anesthesia:  Epidural Episiotomy: None Lacerations: Vaginal superficial  Suture Repair: None Est. Blood Loss (mL): 350   Mom to postpartum.  Baby to nursery-stable.  Simone Curia 09/16/2012, 11:05 AM

## 2012-05-06 NOTE — L&D Delivery Note (Signed)
I was present for entire delivery and agree with above resident note. Dayven Linsley, MD 

## 2012-05-18 ENCOUNTER — Other Ambulatory Visit (HOSPITAL_COMMUNITY)
Admission: RE | Admit: 2012-05-18 | Discharge: 2012-05-18 | Disposition: A | Discharge status: Home / Self Care | Payer: Self-pay | Source: Ambulatory Visit | Attending: Family Medicine | Admitting: Family Medicine

## 2012-05-18 ENCOUNTER — Ambulatory Visit (INDEPENDENT_AMBULATORY_CARE_PROVIDER_SITE_OTHER): Payer: Self-pay | Admitting: Family Medicine

## 2012-05-18 VITALS — BP 111/71 | Temp 98.3°F | Wt 141.0 lb

## 2012-05-18 DIAGNOSIS — Z113 Encounter for screening for infections with a predominantly sexual mode of transmission: Secondary | ICD-10-CM | POA: Insufficient documentation

## 2012-05-18 DIAGNOSIS — Z331 Pregnant state, incidental: Secondary | ICD-10-CM

## 2012-05-18 NOTE — Patient Instructions (Addendum)
Nice to see you again. You are doing great. Please take your zantac two times per day. We will set up an appointment for your ultrasound.  Embarazo - Segundo trimestre (Pregnancy - Second Trimester) El segundo trimestre del Psychiatrist (del 3 al ) es un perodo de evolucin rpida para usted y el beb. Hacia el final del sexto mes, el beb mide aproximadamente 23 cm y pesa 680 g. Comenzar a Pharmacologist del beb [place] 18 y las 20 [address] de [place]. Podr sentir las pataditas ("quickening en ingls"). Hay un rpido [employer]. Puede segregar un lquido claro Charity fundraiser) de las [place]. Quizs sienta pequeas contracciones en el vientre (tero) Esto se conoce como falso trabajo de parto o contracciones de Braxton-Hicks. Es como una prctica del trabajo de parto que se produce cuando el beb est listo para salir. Generalmente los problemas de vmitos matinales ya se han superado hacia el final del Medical sales representative. Algunas mujeres desarrollan pequeas manchas oscuras (que se denominan cloasma, mscara del embarazo) en la cara que normalmente se van luego del nacimiento del beb. La exposicin al sol empeora las manchas. Puede desarrollarse acn en algunas mujeres embarazadas, y puede desaparecer en aquellas que ya tienen acn. EXAMENES PRENATALES  Durante los [employer], deber seguir realizando pruebas de [place], segn avance el [place]. Estas pruebas se realizan para controlar su salud y la del beb. Tambin se realizan anlisis de sangre para [employer] niveles de [place]. La anemia (bajo nivel de hemoglobina) es frecuente durante el embarazo. Para prevenirla, se administran hierro y vitaminas. Tambin se le realizarn exmenes para saber si tiene diabetes entre las 24 y las 28 semanas del [place]. Podrn repetirle algunas de las [employer] hicieron previamente.  En cada visita le medirn el tamao del tero. Esto se realiza para asegurarse de que el beb est  creciendo correctamente de acuerdo al estado del [place].  Tambin en cada visita prenatal controlarn su presin arterial. Esto se realiza para asegurarse de que no tenga toxemia.  Se controlar su orina para asegurarse de que no tenga infecciones, diabetes o protena en la orina.  Se controlar su peso regularmente para asegurarse que el aumento ocurre al ritmo indicado. Esto se hace para asegurarse que usted y el beb tienen una evolucin normal.  En algunas ocasiones se realiza una prueba de ultrasonido para confirmar el correcto desarrollo y evolucin del beb. Esta prueba se realiza con ondas sonoras inofensivas para el beb, de modo que el profesional pueda calcular ms precisamente la fecha del [place]. Algunas veces se realizan pruebas especializadas del lquido amnitico que rodea al beb. Esta prueba se denomina amniocentesis. El lquido amnitico se obtiene introduciendo una aguja en el vientre (abdomen). Se realiza para Conservator, museum/gallery en los que existe alguna preocupacin acerca de algn problema gentico que pueda sufrir el beb. En ocasiones se lleva a cabo cerca del final del embarazo, si es necesario inducir al [employer]. En este caso se realiza para asegurarse que los pulmones del beb estn lo suficientemente maduros como para que pueda vivir fuera del tero. CAMBIOS QUE OCURREN EN EL SEGUNDO TRIMESTRE DEL EMBARAZO Su organismo atravesar numerosos cambios durante el [employer]. Estos pueden variar de Neomia Dear persona a otra. [place] con el profesional que la asiste acerca los cambios que usted note y que la preocupen.  Durante el segundo trimestre probablemente sienta un aumento del apetito. Es normal tener "antojos" de Development worker, community. Esto vara de Neomia Dear persona a  otra y de un embarazo a Therapist, art.  El abdomen inferior comenzar a abultarse.  Podr tener la necesidad de Geographical information systems officer con ms frecuencia debido a que el tero y el beb presionan sobre la vejiga. Tambin es  frecuente contraer ms infecciones urinarias durante el embarazo (dolor al [employer]oco[employer]illips). Puede evitarlas bebiendo gran cantidad de lquidos y vaciando la vejiga antes y despus de Sales promotion account executive.  Podrn aparecer las primeras estras en las caderas, abdomen y [place]. Estos son cambios normales del cuerpo durante el [place]. No existen medicamentos ni ejercicios que puedan prevenir [employer].  Es posible que comience a desarrollar venas inflamadas y abultadas (varices) en las piernas. El uso de medias de descanso, Optometrist sus pies durante 15 minutos, 3 a 4 veces al da y Film/video editor la sal en su dieta ayuda a Journalist, newspaper.  Podr sentir Engineering geologist gstrica a medida que el tero crece y Doctor, general practice. Puede tomar anticidos, con la autorizacin de su mdico, para Financial planner. Tambin es til ingerir pequeas comidas 4 a 5 veces al Futures trader.  La constipacin puede tratarse con un laxante o agregando fibra a su dieta. Beber grandes cantidades de lquidos, comer vegetales, frutas y granos integrales es de [place].  Tambin es beneficioso practicar actividad fsica. Si ha sido una persona Engineer, mining, podr continuar con la [employer]son de las actividades durante el mismo. Si ha sido [employer], puede ser beneficioso que comience con un programa de ejercicios, Museum/gallery exhibitions officer.  Puede desarrollar hemorroides (vrices en el recto) hacia el final del segundo trimestre. Tomar baos de asiento tibios y Chemical engineer cremas recomendadas por el profesional que lo asiste sern de ayuda para los problemas de hemorroides.  Tambin podr Financial risk analyst de espalda durante este momento de su embarazo. Evite levantar objetos pesados, utilice zapatos de taco bajo y [place] buena postura para ayudar a reducir los problemas de [place].  Algunas mujeres embarazadas desarrollan hormigueo y adormecimiento de la mano y los dedos debido a la hinchazn y compresin de los  ligamentos de la mueca (sndrome del tnel carpiano). Esto desaparece una vez que el beb nace.  Como sus pechos se agrandan, Pension scheme manager un sujetador ms grande. Use un sostn de soporte, cmodo y de algodn. No utilice un sostn para amamantar hasta el ltimo mes de embarazo si va a amamantar al beb.  Podr observar una lnea oscura desde el ombligo hacia la zona pbica denominada linea nigra.  Podr observar que sus mejillas se ponen coloradas debido al aumento de flujo sanguneo en la cara.  Podr desarrollar "araitas" en la cara, cuello y pecho. Esto desaparece una vez que el beb nace. INSTRUCCIONES PARA EL CUIDADO DOMICILIARIO  Es extremadamente importante que evite el cigarrillo, hierbas medicinales, alcohol y las drogas no prescriptas durante el Psychiatrist. Estas sustancias qumicas afectan la formacin y el desarrollo del beb. Evite estas sustancias durante todo el embarazo para asegurar el nacimiento de un beb sano.  La mayor parte de los cuidados que se aconsejan son los mismos que los indicados para Financial risk analyst trimestre del Psychiatrist. Cumpla con las citas tal como se le indic. Siga las instrucciones del profesional que lo asiste con respecto al uso de los medicamentos, el ejercicio y Psychologist, forensic.  Durante el embarazo debe obtener nutrientes para usted y para su beb. Consuma alimentos balanceados a intervalos regulares. Elija alimentos como carne, pescado, [place] y otros productos lcteos descremados, vegetales, frutas, panes integrales y cereales. El [employer]  informar cul es el aumento de peso ideal.  Las relaciones sexuales fsicas pueden continuarse hasta cerca del fin del embarazo si no existen otros problemas. Estos problemas pueden ser la prdida temprana (prematura) de lquido amnitico de las [place], sangrado vaginal, dolor abdominal u otros problemas mdicos o del Psychiatrist.  Realice [employer], si no tiene restricciones. Consulte con el  profesional que la asiste si no sabe con certeza si determinados ejercicios son seguros. El mayor aumento de peso tiene Environmental consultant durante los ltimos 2 trimestres del Psychiatrist. El ejercicio la ayudar a:  Engineering geologist.  Ponerla en forma para el parto.  Ayudarla a perder peso luego de haber dado a luz.  Use un buen sostn o como los que se usan para hacer deportes para Paramedic la sensibilidad de las [place]. Tambin puede serle til si lo [place] mientras duerme. Si pierde Product manager, podr [employer].  No utilice la baera con agua caliente, baos turcos y saunas durante el [address]  Utilice el cinturn de seguridad sin excepcin cuando conduzca. Este la proteger a usted y al beb en caso de accidente.  Evite comer carne cruda, queso crudo, y el contacto con los utensilios y desperdicios de los gatos. Estos elementos contienen grmenes que pueden causar defectos de nacimiento en el beb.  El segundo trimestre es un buen momento para visitar a su dentista y Software engineer si an no lo ha hecho. Es Primary school teacher los dientes limpios. Utilice un cepillo de dientes blando. Cepllese ms suavemente durante el embarazo.  Es ms fcil perder algo de orina durante el [place]. Apretar y Chief Operating Officer los msculos de la pelvis la ayudar con este problema. Practique detener la miccin cuando est en el bao. Estos son los mismos msculos que Development worker, international aid. Son [employer] mismos msculos que utiliza cuando trata de [employer] gases. Puede practicar apretando estos msculos 10 veces, y repetir esto 3 veces por da aproximadamente. Una vez que conozca qu msculos debe apretar, no realice estos ejercicios durante la miccin. Puede favorecerle una infeccin si la orina vuelve hacia atrs.  Pida ayuda si tiene necesidades econmicas, de asesoramiento o nutricionales durante el [place]. El profesional podr ayudarla con respecto a estas necesidades, o derivarla a otros  especialistas.  La piel puede ponerse grasa. Si esto sucede, lvese la cara con un jabn [place], utilice un humectante no graso y [place] con base de aceite o crema. CONSUMO DE MEDICAMENTOS Y DROGAS DURANTE EL EMBARAZO  Contine tomando las vitaminas apropiadas para esta etapa tal como se le indic. Las vitaminas deben contener un miligramo de cido flico y deben suplementarse con hierro. Guarde todas las vitaminas fuera del alcance de los nios. La ingestin de slo un par de vitaminas o tabletas que contengan hierro puede ocasionar la [employer] en un beb o en un nio pequeo.  Evite el uso de [place], inclusive los de venta libre y hierbas que no hayan sido prescriptos o indicados por el profesional que la asiste. Algunos medicamentos pueden causar problemas fsicos al beb. Utilice los medicamentos de venta libre o de prescripcin para Chief Technology Officer, Environmental health practitioner o la [place], segn se lo indique el profesional que lo asiste. No utilice aspirina.  El consumo de alcohol est relacionado con ciertos defectos de nacimiento. Esto incluye el sndrome de alcoholismo fetal. Debe evitar el consumo de alcohol en cualquiera de sus formas. El cigarrillo causa nacimientos prematuros y bebs de [place]. El [place] de drogas  recreativas est absolutamente prohibido. Son muy nocivas para el beb. Un beb que nace de [employer], ser adicto al nacer. Ese beb tendr los mismos sntomas de abstinencia que un adulto.  Infrmele al profesional si consume alguna droga.  No consuma drogas ilegales. Pueden causarle mucho dao al beb. SOLICITE ATENCIN MDICA SI: Tiene preguntas o preocupaciones durante su embarazo. Es mejor que llame para Science writer las dudas que esperar hasta su prxima visita prenatal. Thressa Sheller forma se sentir ms tranquila.  SOLICITE ATENCIN MDICA DE INMEDIATO SI:  La temperatura oral se eleva sin motivo por encima de 102 F (38.9 C) o segn le indique el profesional que lo asiste.  Tiene  una prdida de lquido por la vagina (canal de parto). Si sospecha una ruptura de las [place], tmese la temperatura y llame al profesional para informarlo sobre esto.  Observa unas pequeas manchas, una hemorragia vaginal o elimina cogulos. Notifique al profesional acerca de la cantidad y de cuntos apsitos est utilizando. Unas pequeas manchas de sangre son algo comn durante el Psychiatrist, especialmente despus de Sales promotion account executive.  Presenta un olor desagradable en la secrecin vaginal y observa un cambio en el color, de transparente a blanco.  Contina con las nuseas y no obtiene alivio de los remedios indicados. Vomita sangre o algo similar a la borra del caf.  [place] o sube ms de 900 g. en una semana, o segn lo indicado por el profesional que la asiste.  Observa que se le [employer], las manos, los pies o las piernas.  Ha estado expuesta a la rubola y no ha sufrido la enfermedad.  Ha estado expuesta a la quinta enfermedad o a la varicela.  Presenta dolor abdominal. Las molestias en el ligamento redondo son Neomia Dear causa no cancerosa (benigna) frecuente de dolor abdominal durante el embarazo. El profesional que la asiste deber evaluarla.  Presenta dolor de cabeza intenso que no se [place].  Presenta fiebre, diarrea, dolor al orinar o le falta la respiracin.  Presenta dificultad para ver, visin borrosa, o visin doble.  Sufre una cada, un accidente de trnsito o cualquier tipo de trauma.  Vive en un hogar en el que existe violencia fsica o mental. Document Released: 01/30/2005 Document Revised: 07/15/2011 [facility] Patient Information 2013 [place], [place].

## 2012-05-18 NOTE — Progress Notes (Signed)
Pattijo Juste is a 27 y.o. G3P2002 at [redacted]w[redacted]d for routine follow up.  She reports heart burn, improved nausea and vomiting, no contractions, cramping, leakage of fluid, or vaginal bleeding. Patient states has been taking zantac only as needed. States occasionally has vomiting in the morning though this is much improved.  See flow sheet for details.  A/P: Pregnancy at [redacted]w[redacted]d.  Doing well. Discussed need to take zantac twice daily to get full effect. Pregnancy issues include history of GDM with normal early glucola this pregnancy. Also, with history of pre-eclampsia in the post-partum period with last pregnancy. BP has been stable this pregnancy. Anatomy scan to be scheduled. Patient is adopt a mom and they do not pay for this. Discussed that we would schedule this with Dr. Elsie Stain office and the cost would be $300. Patient stated she would work on getting the orange card, but if unable to obtain this by the [place] appointment, she would pay for it herself. PAP smear and GC/Chlam culture done today. Preterm labor precautions reviewed. Follow up 4 weeks in OB clinic.

## 2012-06-17 ENCOUNTER — Ambulatory Visit (INDEPENDENT_AMBULATORY_CARE_PROVIDER_SITE_OTHER): Payer: Self-pay | Admitting: Family Medicine

## 2012-06-17 VITALS — BP 109/69 | Temp 98.1°F | Wt 145.0 lb

## 2012-06-17 DIAGNOSIS — Z331 Pregnant state, incidental: Secondary | ICD-10-CM

## 2012-06-17 DIAGNOSIS — Z3490 Encounter for supervision of normal pregnancy, unspecified, unspecified trimester: Secondary | ICD-10-CM

## 2012-06-17 MED ORDER — RANITIDINE HCL 150 MG PO TABS: 150.0000 mg | ORAL_TABLET | Freq: Two times a day (BID) | ORAL | Status: DC

## 2012-06-17 NOTE — Patient Instructions (Signed)
Embarazo  Tercer trimestre  (Pregnancy - Third Trimester) El tercer trimestre del embarazo (los ltimos 3 meses) es el perodo en el cual tanto usted como su beb crecen con ms rapidez. El beb alcanza un largo de aproximadamente 50 cm. y pesa entre 2,700 y 4,500 kg. El beb gana ms tejido graso y est listo para la vida fuera del cuerpo de la madre. Mientras estn en el interior, los bebs tienen perodos de sueo y vigilia, succionan el pulgar y tienen hipo. Quizs sienta pequeas contracciones del tero. Este es el falso trabajo de parto. Tambin se las conoce como contracciones de Braxton-Hicks . Es como una prctica del parto. Los problemas ms habituales de esta etapa del embarazo incluyen mayor dificultad para respirar, hinchazn de las manos y los pies por retencin de lquidos y la necesidad de orinar con ms frecuencia debido a que el tero y el beb presionan sobre la vejiga.  EXAMENES PRENATALES   Durante los exmenes prenatales, deber seguir realizndose anlisis de sangre. Estas pruebas se realizan para controlar su salud y la del beb. Los anlisis de sangre se realizan para conocer los niveles de algunos compuestos de la sangre (hemoglobina). La anemia (bajo nivel de hemoglobina) es frecuente durante el embarazo. Para prevenirla, se administran hierro y vitaminas. Tambin le tomarn nuevas anlisis para descartar diabetes. Podrn repetirle algunas de las pruebas que le hicieron previamente.  En cada visita le medirn el tamao del tero. Esto permite asegurar que el beb se desarrolla adecuadamente, segn la fecha del embarazo.  Le controlarn la presin arterial en cada visita prenatal. Esto es para asegurarse de que no sufre toxemia.  Le harn un anlisis de orina en cada visita prenatal, para descartar infecciones, diabetes y la presencia de protenas.  Tambin en cada visita controlarn su peso. Esto se realiza para asegurarse que aumenta de peso al ritmo indicado y que usted y su  beb evolucionan normalmente.  En algunas ocasiones se realiza una prueba de ultrasonido para confirmar el correcto desarrollo y evolucin del beb. Esta prueba se realiza con ondas sonoras inofensivas para el beb, de modo que el profesional pueda calcular ms precisamente la fecha del parto.  Analice con su mdico los analgsicos y la anestesia que recibir durante el trabajo de parto y el parto.  Comente la posibilidad de que necesite una cesrea y qu anestesia se recibir.  Informe a su mdico si sufre violencia familiar mental o fsica. A veces, se indica la prueba especializada sin estrs, la prueba de tolerancia a las contracciones y el perfil biofsico para asegurarse de que el beb no tiene problemas. El estudio del lquido amnitico que rodea al beb se llama amniocentesis. El lquido amnitico se obtiene introduciendo una aguja en el vientre (abdomen ). En ocasiones se lleva a cabo cerca del final del embarazo, si es necesario inducir a un parto. En este caso se realiza para asegurarse que los pulmones del beb estn lo suficientemente maduros como para que pueda vivir fuera del tero. Si los pulmones no han madurado y es peligroso que el beb nazca, se administrar a la madre una inyeccin de cortisona , 1 a 2 das antes del parto. . Esto ayuda a que los pulmones del beb maduren y sea ms seguro su nacimiento.  CAMBIOS QUE OCURREN EN EL TERCER TRIMESTRE DEL EMBARAZO  Su organismo atravesar numerosos cambios durante el embarazo. Estos pueden variar de una persona a otra. Converse con el profesional que la asiste acerca los cambios que   usted note y que la preocupen.   Durante el ltimo trimestre probablemente sienta un aumento del apetito. Es normal tener "antojos" de ciertas comidas. Esto vara de una persona a otra y de un embarazo a otro.  Podrn aparecer las primeras estras en las caderas, abdomen y mamas. Estos son cambios normales del cuerpo durante el embarazo. No existen  medicamentos ni ejercicios que puedan prevenir estos cambios.  La constipacin puede tratarse con un laxante o agregando fibra a su dieta. Beber grandes cantidades de lquidos, tomar fibras en forma de vegetales, frutas y granos integrales es de gran ayuda.  Tambin es beneficioso practicar actividad fsica. Si ha sido una persona activa hasta el embarazo, podr continuar con la mayora de las actividades durante el mismo. Si ha sido menos activa, puede ser beneficioso que comience con un programa de ejercicios, como realizar caminatas. Consulte con el profesional que la asiste antes de comenzar un programa de ejercicios.  Evite el consumo de cigarrillos, el alcohol, los medicamentos no recetados y las "drogas de la calle" durante el embarazo. Estas sustancias qumicas afectan la formacin y el desarrollo del beb. Evite estas sustancias durante todo el embarazo para asegurar el nacimiento de un beb sano.  Podr sentir dolor de espalda, tener vrices en las venas y hemorroides, o si ya los sufra, pueden empeorar.  Durante el tercer trimestre se cansar con ms facilidad, lo cual es normal.  Los movimientos del beb pueden ser ms fuertes y con ms frecuencia.  Puede que note dificultades para respirar normalmente.  El ombligo puede salir hacia afuera.  A veces sale una secrecin amarilla de las mamas, que se llama calostro.  Podr aparecer una secrecin mucosa con sangre. Esto suele ocurrir entre unos pocos das y una semana antes del parto. INSTRUCCIONES PARA EL CUIDADO EN EL HOGAR   Cumpla con las citas de control. Siga las indicaciones del mdico con respecto al uso de medicamentos, los ejercicios y la dieta.  Durante el embarazo debe obtener nutrientes para usted y para su beb. Consuma alimentos balanceados a intervalos regulares. Elija alimentos como carne, pescado, leche y otros productos lcteos descremados, vegetales, frutas, panes integrales y cereales. El mdico le informar  cul es el aumento de peso ideal.  Las relaciones sexuales pueden continuarse hasta casi el final del embarazo, si no se presentan otros problemas como prdida prematura (antes de tiempo) de lquido amnitico, hemorragia vaginal o dolor en el vientre (abdominal).  Realice actividad fsica todos los das, si no tiene restricciones. Consulte con el profesional que la asiste si no sabe con certeza si determinados ejercicios son seguros. El mayor aumento de peso se producir en los ltimos 2 trimestres del embarazo. El ejercicio ayuda a:  Controlar su peso.  Mantenerse en forma para el trabajo de parto y el parto .  Perder peso despus del parto.  Haga reposo con frecuencia, con las piernas elevadas, o segn lo necesite para evitar los calambres y el dolor de cintura.  Use un buen sostn o como los que se usan para hacer deportes para aliviar la sensibilidad de las mamas. Tambin puede serle til si lo usa mientras duerme. Si pierde calostro, podr utilizar apsitos en el sostn.  No utilice la baera con agua caliente, baos turcos y saunas.  Colquese el cinturn de seguridad cuando conduzca. Este la proteger a usted y al beb en caso de accidente.  Evite comer carne cruda y el contacto con los utensilios y desperdicios de los gatos. Estos elementos   contienen grmenes que pueden causar defectos de nacimiento en el beb.  Es fcil perder algo de orina durante el embarazo. Apretar y fortalecer los msculos de la pelvis la ayudar con este problema. Practique detener la miccin cuando est en el bao. Estos son los mismos msculos que necesita fortalecer. Son tambin los mismos msculos que utiliza cuando trata de evitar despedir gases. Puede practicar apretando estos msculos diez veces, y repetir esto tres veces por da aproximadamente. Una vez que conozca qu msculos debe apretar, no realice estos ejercicios durante la miccin. Puede favorecerle una infeccin si la orina vuelve hacia  atrs.  Pida ayuda si tienen necesidades financieras, teraputicas o nutricionales. El profesional podr ayudarla con respecto a estas necesidades, o derivarla a otros especialistas.  Haga una lista de nmeros telefnicos de emergencia y tngalos disponibles.  Planifique como obtener ayuda de familiares o amigos cuando regrese a casa desde el hospital.  Hacer un ensayo sobre la partida al hospital.  Tome clases prenatales con el padre para entender, practicar y hacer preguntas sobre el trabajo de parto y el alumbramiento.  Preparar la habitacin del beb / busque una guardera.  No viaje fuera de la ciudad a menos que sea absolutamente necesario y con el asesoramiento de su mdico.  Use slo zapatos de tacn bajo o sin tacn para tener mejor equilibrio y evitar cadas. USO DE MEDICAMENTOS Y CONSUMO DE DROGAS DURANTE EL EMBARAZO   Tome las vitaminas apropiadas para esta etapa tal como se le indic. Las vitaminas deben contener un miligramo de cido flico. Guarde todas las vitaminas fuera del alcance de los nios. La ingestin de slo un par de vitaminas o tabletas que contengan hierro pueden ocasionar la muerte en un beb o en un nio pequeo.  Evite el uso de todos los medicamentos, incluyendo hierbas, medicamentos de venta libre, sin receta o que no hayan sido sugeridos por su mdico. Slo tome medicamentos de venta libre o medicamentos recetados para el dolor, el malestar o fiebre como lo indique su mdico. No tome aspirina, ibuprofeno (Motrin, Advil, Nuprin) o naproxeno (Aleve) excepto que su mdico se lo indique.  Infrmele al profesional si consume alguna droga.  El alcohol se relaciona con ciertos defectos congnitos. Incluye el sndrome de alcoholismo fetal. Debe evitar absolutamente el consumo de alcohol, en cualquier forma. El fumar produce baja tasa de natalidad y bebs prematuros.  Las drogas ilegales o de la calle son muy perjudiciales para el beb. Estn absolutamente  prohibidas. Un beb que nace de una madre adicta, ser adicto al nacer. Ese beb tendr los mismos sntomas de abstinencia que un adulto. SOLICITE ATENCIN MDICA SI:  Tiene preguntas o preocupaciones relacionadas con el embarazo. Es mejor que llame para formular las preguntas si no puede esperar hasta la prxima visita, que sentirse preocupada por ellas.  DECISIONES ACERCA DE LA CIRCUNCISIN  Usted puede saber o no cul es el sexo de su beb. Si ya sabe que ser un varn, este es el momento de pensar acerca de la circuncisin. La circuncisin es la extirpacin del prepucio. Esta es la piel que cubre el extremo sensible del pene. No hay un motivo mdico que lo justifique. Generalmente la decisin se toma segn lo que sea popular en ese momento, o segn creencias religiosas. Podr conversar estos temas con su mdico o con el pediatra.  SOLICITE ATENCIN MDICA DE INMEDIATO SI:   La temperatura oral le sube a ms de 102 F (38.9 C) o lo que su mdico le   indique.  Tiene una prdida de lquido por la vagina (canal de parto). Si sospecha una ruptura de las membranas, tmese la temperatura y llame al profesional para informarlo sobre esto.  Observa unas pequeas manchas, una hemorragia vaginal o elimina cogulos. Notifique al profesional acerca de la cantidad y de cuntos apsitos est utilizando.  Presenta un olor desagradable en la secrecin vaginal y observa un cambio en el color, de transparente a blanco.  Ha vomitado durante ms de 24 horas.  Siente escalofros o le sube la fiebre.  Le falta el aire.  Siente ardor al orinar.  Baja o sube ms de 2 libras (900 g), o segn lo indicado por el profesional que la asiste.  Observa que sbitamente se le hinchan el rostro, las manos, los pies o las piernas.  Siente dolor en el vientre (abdominal). Las molestias en el ligamento redondo son una causa benigna frecuente de dolor abdominal durante el embarazo. El profesional que la asiste deber  evaluarla.  Presenta dolor de cabeza intenso que no se alivia.  Tiene problemas visuales, visin doble o borrosa.  Si no siente los movimientos del beb durante ms de 1 hora. Si piensa que el beb no se mueve tanto como lo haca habitualmente, coma algo que contenga azcar y recustese sobre el lado izquierdo durante una hora. El beb debe moverse al menos 4  5 veces por hora. Comunquese inmediatamente si el beb se mueve menos que lo indicado.  Se cae, se ve involucrada en un accidente automovilstico o sufre algn tipo de traumatismo.  En su hogar hay violencia mental o fsica. Document Released: 01/30/2005 Document Revised: 10/22/2011 [employer] Patient Information 2013 [employer].  

## 2012-06-18 NOTE — Progress Notes (Signed)
Colleen Thornton is a 27 y.o. G3P2002 at [redacted]w[redacted]d for routine follow up.  She reports no bleeding, fluid loss, or contractions. States had some cramping for about 2 weeks, but this has resolved. Now complains of sharp pain in groin. Occurs mostly when moving, though occasionally when laying still. Also states has had some white discharge with no blood mixed in. Continues to have reflux.  See flow sheet for details.  A/P: Pregnancy at [redacted]w[redacted]d.  Doing well.   Pregnancy issues include previous history of GDM and preeclampsia. Had normal early glucola and has had normal BPs. Reflux, zantac refilled. Will obtain CBC, RPR, HIV at next visit. Childbirth and education classes were offered. Preterm labor precautions reviewed. Follow up 2 weeks.

## 2012-07-02 ENCOUNTER — Encounter: Payer: Self-pay | Admitting: Family Medicine

## 2012-07-08 ENCOUNTER — Other Ambulatory Visit: Payer: Self-pay | Admitting: Family Medicine

## 2012-07-08 ENCOUNTER — Ambulatory Visit (INDEPENDENT_AMBULATORY_CARE_PROVIDER_SITE_OTHER): Payer: Self-pay | Admitting: Family Medicine

## 2012-07-08 VITALS — BP 104/69 | Temp 98.6°F | Wt 151.0 lb

## 2012-07-08 DIAGNOSIS — Z3490 Encounter for supervision of normal pregnancy, unspecified, unspecified trimester: Secondary | ICD-10-CM

## 2012-07-08 LAB — CBC
MCH: 31.4 pg (ref 26.0–34.0)
MCHC: 34.4 g/dL (ref 30.0–36.0)
MCV: 91.4 fL (ref 78.0–100.0)
Platelets: 127 10*3/uL — ABNORMAL LOW (ref 150–400)
RBC: 3.82 MIL/uL — ABNORMAL LOW (ref 3.87–5.11)

## 2012-07-08 LAB — GLUCOSE, CAPILLARY: Comment 1: 1

## 2012-07-08 NOTE — Patient Instructions (Addendum)
Embarazo  Tercer trimestre  (Pregnancy - Third Trimester) El tercer trimestre del embarazo (los ltimos 3 meses) es el perodo en el cual tanto usted como su beb crecen con ms rapidez. El beb alcanza un largo de aproximadamente 50 cm. y pesa entre 2,700 y 4,500 kg. El beb gana ms tejido graso y est listo para la vida fuera del cuerpo de la madre. Mientras estn en el interior, los bebs tienen perodos de sueo y vigilia, succionan el pulgar y tienen hipo. Quizs sienta pequeas contracciones del tero. Este es el falso trabajo de parto. Tambin se las conoce como contracciones de Braxton-Hicks . Es como una prctica del parto. Los problemas ms habituales de esta etapa del embarazo incluyen mayor dificultad para respirar, hinchazn de las manos y los pies por retencin de lquidos y la necesidad de orinar con ms frecuencia debido a que el tero y el beb presionan sobre la vejiga.  EXAMENES PRENATALES   Durante los exmenes prenatales, deber seguir realizndose anlisis de sangre. Estas pruebas se realizan para controlar su salud y la del beb. Los anlisis de sangre se realizan para conocer los niveles de algunos compuestos de la sangre (hemoglobina). La anemia (bajo nivel de hemoglobina) es frecuente durante el embarazo. Para prevenirla, se administran hierro y vitaminas. Tambin le tomarn nuevas anlisis para descartar diabetes. Podrn repetirle algunas de las pruebas que le hicieron previamente.  En cada visita le medirn el tamao del tero. Esto permite asegurar que el beb se desarrolla adecuadamente, segn la fecha del embarazo.  Le controlarn la presin arterial en cada visita prenatal. Esto es para asegurarse de que no sufre toxemia.  Le harn un anlisis de orina en cada visita prenatal, para descartar infecciones, diabetes y la presencia de protenas.  Tambin en cada visita controlarn su peso. Esto se realiza para asegurarse que aumenta de peso al ritmo indicado y que usted y su  beb evolucionan normalmente.  En algunas ocasiones se realiza una prueba de ultrasonido para confirmar el correcto desarrollo y evolucin del beb. Esta prueba se realiza con ondas sonoras inofensivas para el beb, de modo que el profesional pueda calcular ms precisamente la fecha del parto.  Analice con su mdico los analgsicos y la anestesia que recibir durante el trabajo de parto y el parto.  Comente la posibilidad de que necesite una cesrea y qu anestesia se recibir.  Informe a su mdico si sufre violencia familiar mental o fsica. A veces, se indica la prueba especializada sin estrs, la prueba de tolerancia a las contracciones y el perfil biofsico para asegurarse de que el beb no tiene problemas. El estudio del lquido amnitico que rodea al beb se llama amniocentesis. El lquido amnitico se obtiene introduciendo una aguja en el vientre (abdomen ). En ocasiones se lleva a cabo cerca del final del embarazo, si es necesario inducir a un parto. En este caso se realiza para asegurarse que los pulmones del beb estn lo suficientemente maduros como para que pueda vivir fuera del tero. Si los pulmones no han madurado y es peligroso que el beb nazca, se administrar a la madre una inyeccin de cortisona , 1 a 2 das antes del parto. . Esto ayuda a que los pulmones del beb maduren y sea ms seguro su nacimiento.  CAMBIOS QUE OCURREN EN EL TERCER TRIMESTRE DEL EMBARAZO  Su organismo atravesar numerosos cambios durante el embarazo. Estos pueden variar de una persona a otra. Converse con el profesional que la asiste acerca los cambios que   usted note y que la preocupen.   Durante el ltimo trimestre probablemente sienta un aumento del apetito. Es normal tener "antojos" de ciertas comidas. Esto vara de una persona a otra y de un embarazo a otro.  Podrn aparecer las primeras estras en las caderas, abdomen y mamas. Estos son cambios normales del cuerpo durante el embarazo. No existen  medicamentos ni ejercicios que puedan prevenir estos cambios.  La constipacin puede tratarse con un laxante o agregando fibra a su dieta. Beber grandes cantidades de lquidos, tomar fibras en forma de vegetales, frutas y granos integrales es de gran ayuda.  Tambin es beneficioso practicar actividad fsica. Si ha sido una persona activa hasta el embarazo, podr continuar con la mayora de las actividades durante el mismo. Si ha sido menos activa, puede ser beneficioso que comience con un programa de ejercicios, como realizar caminatas. Consulte con el profesional que la asiste antes de comenzar un programa de ejercicios.  Evite el consumo de cigarrillos, el alcohol, los medicamentos no recetados y las "drogas de la calle" durante el embarazo. Estas sustancias qumicas afectan la formacin y el desarrollo del beb. Evite estas sustancias durante todo el embarazo para asegurar el nacimiento de un beb sano.  Podr sentir dolor de espalda, tener vrices en las venas y hemorroides, o si ya los sufra, pueden empeorar.  Durante el tercer trimestre se cansar con ms facilidad, lo cual es normal.  Los movimientos del beb pueden ser ms fuertes y con ms frecuencia.  Puede que note dificultades para respirar normalmente.  El ombligo puede salir hacia afuera.  A veces sale una secrecin amarilla de las mamas, que se llama calostro.  Podr aparecer una secrecin mucosa con sangre. Esto suele ocurrir entre unos pocos das y una semana antes del parto. INSTRUCCIONES PARA EL CUIDADO EN EL HOGAR   Cumpla con las citas de control. Siga las indicaciones del mdico con respecto al uso de medicamentos, los ejercicios y la dieta.  Durante el embarazo debe obtener nutrientes para usted y para su beb. Consuma alimentos balanceados a intervalos regulares. Elija alimentos como carne, pescado, leche y otros productos lcteos descremados, vegetales, frutas, panes integrales y cereales. El mdico le informar  cul es el aumento de peso ideal.  Las relaciones sexuales pueden continuarse hasta casi el final del embarazo, si no se presentan otros problemas como prdida prematura (antes de tiempo) de lquido amnitico, hemorragia vaginal o dolor en el vientre (abdominal).  Realice actividad fsica todos los das, si no tiene restricciones. Consulte con el profesional que la asiste si no sabe con certeza si determinados ejercicios son seguros. El mayor aumento de peso se producir en los ltimos 2 trimestres del embarazo. El ejercicio ayuda a:  Controlar su peso.  Mantenerse en forma para el trabajo de parto y el parto .  Perder peso despus del parto.  Haga reposo con frecuencia, con las piernas elevadas, o segn lo necesite para evitar los calambres y el dolor de cintura.  Use un buen sostn o como los que se usan para hacer deportes para aliviar la sensibilidad de las mamas. Tambin puede serle til si lo usa mientras duerme. Si pierde calostro, podr utilizar apsitos en el sostn.  No utilice la baera con agua caliente, baos turcos y saunas.  Colquese el cinturn de seguridad cuando conduzca. Este la proteger a usted y al beb en caso de accidente.  Evite comer carne cruda y el contacto con los utensilios y desperdicios de los gatos. Estos elementos   contienen grmenes que pueden causar defectos de nacimiento en el beb.  Es fcil perder algo de orina durante el embarazo. Apretar y fortalecer los msculos de la pelvis la ayudar con este problema. Practique detener la miccin cuando est en el bao. Estos son los mismos msculos que necesita fortalecer. Son tambin los mismos msculos que utiliza cuando trata de evitar despedir gases. Puede practicar apretando estos msculos diez veces, y repetir esto tres veces por da aproximadamente. Una vez que conozca qu msculos debe apretar, no realice estos ejercicios durante la miccin. Puede favorecerle una infeccin si la orina vuelve hacia  atrs.  Pida ayuda si tienen necesidades financieras, teraputicas o nutricionales. El profesional podr ayudarla con respecto a estas necesidades, o derivarla a otros especialistas.  Haga una lista de nmeros telefnicos de emergencia y tngalos disponibles.  Planifique como obtener ayuda de familiares o amigos cuando regrese a casa desde el hospital.  Hacer un ensayo sobre la partida al hospital.  Tome clases prenatales con el padre para entender, practicar y hacer preguntas sobre el trabajo de parto y el alumbramiento.  Preparar la habitacin del beb / busque una guardera.  No viaje fuera de la ciudad a menos que sea absolutamente necesario y con el asesoramiento de su mdico.  Use slo zapatos de tacn bajo o sin tacn para tener mejor equilibrio y evitar cadas. USO DE MEDICAMENTOS Y CONSUMO DE DROGAS DURANTE EL EMBARAZO   Tome las vitaminas apropiadas para esta etapa tal como se le indic. Las vitaminas deben contener un miligramo de cido flico. Guarde todas las vitaminas fuera del alcance de los nios. La ingestin de slo un par de vitaminas o tabletas que contengan hierro pueden ocasionar la muerte en un beb o en un nio pequeo.  Evite el uso de todos los medicamentos, incluyendo hierbas, medicamentos de venta libre, sin receta o que no hayan sido sugeridos por su mdico. Slo tome medicamentos de venta libre o medicamentos recetados para el dolor, el malestar o fiebre como lo indique su mdico. No tome aspirina, ibuprofeno (Motrin, Advil, Nuprin) o naproxeno (Aleve) excepto que su mdico se lo indique.  Infrmele al profesional si consume alguna droga.  El alcohol se relaciona con ciertos defectos congnitos. Incluye el sndrome de alcoholismo fetal. Debe evitar absolutamente el consumo de alcohol, en cualquier forma. El fumar produce baja tasa de natalidad y bebs prematuros.  Las drogas ilegales o de la calle son muy perjudiciales para el beb. Estn absolutamente  prohibidas. Un beb que nace de una madre adicta, ser adicto al nacer. Ese beb tendr los mismos sntomas de abstinencia que un adulto. SOLICITE ATENCIN MDICA SI:  Tiene preguntas o preocupaciones relacionadas con el embarazo. Es mejor que llame para formular las preguntas si no puede esperar hasta la prxima visita, que sentirse preocupada por ellas.  DECISIONES ACERCA DE LA CIRCUNCISIN  Usted puede saber o no cul es el sexo de su beb. Si ya sabe que ser un varn, este es el momento de pensar acerca de la circuncisin. La circuncisin es la extirpacin del prepucio. Esta es la piel que cubre el extremo sensible del pene. No hay un motivo mdico que lo justifique. Generalmente la decisin se toma segn lo que sea popular en ese momento, o segn creencias religiosas. Podr conversar estos temas con su mdico o con el pediatra.  SOLICITE ATENCIN MDICA DE INMEDIATO SI:   La temperatura oral le sube a ms de 102 F (38.9 C) o lo que su mdico le   indique.  Tiene una prdida de lquido por la vagina (canal de parto). Si sospecha una ruptura de las membranas, tmese la temperatura y llame al profesional para informarlo sobre esto.  Observa unas pequeas manchas, una hemorragia vaginal o elimina cogulos. Notifique al profesional acerca de la cantidad y de cuntos apsitos est utilizando.  Presenta un olor desagradable en la secrecin vaginal y observa un cambio en el color, de transparente a blanco.  Ha vomitado durante ms de 24 horas.  Siente escalofros o le sube la fiebre.  Le falta el aire.  Siente ardor al orinar.  Baja o sube ms de 2 libras (900 g), o segn lo indicado por el profesional que la asiste.  Observa que sbitamente se le hinchan el rostro, las manos, los pies o las piernas.  Siente dolor en el vientre (abdominal). Las molestias en el ligamento redondo son una causa benigna frecuente de dolor abdominal durante el embarazo. El profesional que la asiste deber  evaluarla.  Presenta dolor de cabeza intenso que no se alivia.  Tiene problemas visuales, visin doble o borrosa.  Si no siente los movimientos del beb durante ms de 1 hora. Si piensa que el beb no se mueve tanto como lo haca habitualmente, coma algo que contenga azcar y recustese sobre el lado izquierdo durante una hora. El beb debe moverse al menos 4  5 veces por hora. Comunquese inmediatamente si el beb se mueve menos que lo indicado.  Se cae, se ve involucrada en un accidente automovilstico o sufre algn tipo de traumatismo.  En su hogar hay violencia mental o fsica. Document Released: 01/30/2005 Document Revised: 10/22/2011 ExitCare Patient Information 2013 [employer].  

## 2012-07-08 NOTE — Progress Notes (Signed)
Colleen Thornton is a 27 y.o. G3P2002 at [redacted]w[redacted]d for routine follow up.  She reports no complaints.  See flow sheet for details.  A/P: Pregnancy at [redacted]w[redacted]d.  Doing well.   Pregnancy issues include previous history of preeclampsia and gestational DM. Normal early glucola test and normal BPs throughout pregnancy.  Tdapwas not given today. 1 hour glucola, CBC, RPR, and HIV were done today.  Glucola normal. Pregnancy medical home forms were not done today, were done at last visit and reviewed.   RH status was reviewed and pt does not need Rhogam.  Rhogam was not given today.   Childbirth and education classes were offered. Preterm labor precautions reviewed. Follow up 2 weeks in OB clinic as fundal height measuring short of expected.

## 2012-07-09 LAB — RPR

## 2012-07-17 ENCOUNTER — Encounter: Payer: Self-pay | Admitting: Family Medicine

## 2012-07-30 ENCOUNTER — Ambulatory Visit (INDEPENDENT_AMBULATORY_CARE_PROVIDER_SITE_OTHER): Payer: No Typology Code available for payment source | Admitting: Family Medicine

## 2012-07-30 ENCOUNTER — Encounter: Payer: Self-pay | Admitting: Family Medicine

## 2012-07-30 VITALS — BP 113/73 | Wt 151.0 lb

## 2012-07-30 DIAGNOSIS — Z3480 Encounter for supervision of other normal pregnancy, unspecified trimester: Secondary | ICD-10-CM

## 2012-07-30 DIAGNOSIS — Z3483 Encounter for supervision of other normal pregnancy, third trimester: Secondary | ICD-10-CM

## 2012-07-30 MED ORDER — RANITIDINE HCL 150 MG PO TABS: 150.0000 mg | ORAL_TABLET | Freq: Two times a day (BID) | ORAL | Status: DC

## 2012-07-30 NOTE — Progress Notes (Signed)
Patient seen in [facility], visit in Spanish. [redacted]w[redacted]d with anatomy scan at Dr Elsie Stain office (reviewed).  Is taking PNVs. Ran out of Zantac, which was helpful for her GERD.  She eats more than before, coffee makes GERD worse. One Pepsi a day.  Confirmed BW of first two daughters (oldest born with vacuum-assist).  Patient denies domestic violence; lives with husband and two daughters. Verbal administration of PHQ2 is negative.  Unsure about breastfeeding; had significant discomfort with first two daughters and resorted to pump/bottle feeding. Regarding birth spacing, is sure she does not want OCPs or DepoProvera, but is considering IUD. Does have car seat already.  Discussed prior lab results today.  Will need GBS at next visit.  Does not appear at increased risk for GC/CT and is not younger than 25, therefore cervical cultures optional at next visit. Discussed kick counts and signs/sx preterm labor in today's visit. Next visit with Dr Birdie Sons. Paula Compton, MD

## 2012-07-30 NOTE — Patient Instructions (Addendum)
Fue un Research officer, trade union; tiene 33 semanas y 5 dias de Psychiatrist.  Siga tomando las vitaminas prenatales.  Mande' otra receta para Zantac 150mg  a la farmacia de [facility] en [address]; (una tableta cada 12 horas).  Recuerdese que las sodas y la cafeina pueden empeorar el reflujo.  Mantenga la proxima cita con el Dr Karlyne Greenspan para el dia 07 de abril a las 8:30am.  Sport and exercise psychologist  (Preterm Labor) El parto prematuro comienza antes de la semana 37 de [place]. La duracin de un embarazo normal es de 39 a 41 semanas.  CAUSAS  Generalmente no hay una causa que pueda identificarse. Sin embargo, una de las causas conocidas ms frecuentes son las infecciones. Las infecciones del tero, el cuello, la vagina, el lquido [place], la vejiga, los riones y Teacher, adult education de los pulmones (neumona) pueden hacer que el trabajo de parto se inicie. Otras causas son:   Infecciones urogenitales, como infecciones por hongos y vaginosis bacteriana.  Anormalidades uterinas (forma del tero, sptum uterino, fibromas, hemorragias en la placenta).  Un cuello que ha sido operado y se abre prematuramente.  Malformaciones del beb.  Gestaciones mltiples (mellizos, trillizos y ms).  Ruptura del saco amnitico. :Otros factores de riesgo del parto prematuro son   Historia previa de Sport and exercise psychologist.  Ruptura prematura de las [place].  La placenta cubre la apertura del cuello (placenta previa).  La placenta se separa del tero (abrupcin placentaria).  El cuello es demasiado dbil para contener al beb en el tero (cuello incompetente).  Hay mucho lquido en el saco amnitico (polihidramnios).  Consumo de drogas o hbito de fumar durante Firefighter.  No aumentar de peso lo suficiente durante el [employer].  Mujeres menores de 18 aos o [address] de [address] [place].  Nivel socioeconmico bajo.  Raza afroamericana. SNTOMAS  Los signos y sntomas son:   Clicos del tipo menstrual  Contracciones con un  intervalo entre 30 y 70 segundos, comienzan a ser regulares, se hacen ms frecuentes y se hacen ms intensas y dolorosas.  Contracciones que comienzan en la parte superior del tero y se expanden hacia abajo, hacia la zona inferior del abdomen y la espalda.  Sensacin de presin en la pelvis o dolor en la espalda.  Aparece una secrecin acuosa o sanguinolenta por la vagina. DIAGNSTICO  El diagnstico puede confirmarse:   Con un examen vaginal.  Ecografa del cuello.  Muestra (hisopado) de las secreciones crvico-vaginales. Estas muestras se analizan para buscar la presencia de fibronectina fetal. Esta protena que se encuentra en las secreciones del tero y se asocia con el parto prematuro.  Monitoreo fetal TRATAMIENTO  Segn el tiempo del Psychiatrist y otras [place], el mdico puede indicar reposo en cama. Si es necesario, le indicarn medicamentos para TEFL teacher las contracciones y apurar la maduracin de los pulmones del feto. Si el trabajo de parto se inicia antes de las 34 semanas de [place], se recomienda la hospitalizacin. El tratamiento depende de las condiciones en que se encuentre la madre y el beb.  PREVENCIN  Hay algunas cosas que [employer] puede hacer para disminuir el riesgo de trabajo de parto prematuro en futuros [employer]. Una mam puede:   Dejar de fumar.  Mantener un peso saludable y evitar sustancias qumicas y drogas innecesarias.  Controlar todo tipo de infeccin.  Informar al mdico si tiene una historia conocida de parto prematuro. Document Released: 07/30/2007 Document Revised: 07/15/2011 [facility] Patient Information 2013 [place], [place].

## 2012-08-10 ENCOUNTER — Ambulatory Visit (INDEPENDENT_AMBULATORY_CARE_PROVIDER_SITE_OTHER): Payer: No Typology Code available for payment source | Admitting: Family Medicine

## 2012-08-10 VITALS — BP 114/74 | Temp 98.2°F | Wt 154.0 lb

## 2012-08-10 DIAGNOSIS — G5600 Carpal tunnel syndrome, unspecified upper limb: Secondary | ICD-10-CM

## 2012-08-10 DIAGNOSIS — R3915 Urgency of urination: Secondary | ICD-10-CM

## 2012-08-10 DIAGNOSIS — G5601 Carpal tunnel syndrome, right upper limb: Secondary | ICD-10-CM

## 2012-08-10 LAB — POCT UA - MICROSCOPIC ONLY

## 2012-08-10 LAB — POCT URINALYSIS DIPSTICK
Bilirubin, UA: NEGATIVE
Blood, UA: NEGATIVE
Glucose, UA: NEGATIVE
Ketones, UA: NEGATIVE
Nitrite, UA: NEGATIVE
Protein, UA: NEGATIVE
Spec Grav, UA: >=1.03
Urobilinogen, UA: 0.2
pH, UA: 6.5

## 2012-08-10 NOTE — Assessment & Plan Note (Signed)
Likely related to pregnancy and compression of bladder by uterus. Plan: will obtain UA to r/o UTI.

## 2012-08-10 NOTE — Progress Notes (Signed)
Colleen Thornton is a 27 y.o. G3P2002 at 106w2d for routine follow up.  She reports right hand numbness and tingling since Wednesday. Is occuring in thumb and first two fingers and thenar eminence. Additionally complaining of urgency with urination. No pain with urination, no frequency.  See flow sheet for details.  PE:  R Hand: minimal swelling, normal sensation, positive tenel sign  A/P: Pregnancy at [redacted]w[redacted]d.  Doing well.   Pregnancy issues include history of GDM and preeclampsia.  Infant feeding choice unsure Contraception choice thinking IUD at this time. Infant circumcision desired not applicable  Tdapwas not given today. GBS/GC/CZ testing was not performed today. Will need GBS at next visit. Will defer GC/Chlam given at low risk.  Preterm labor precautions reviewed. Follow up 1 weeks with Dr. Benjamin Stain who will provide back up coverage in case Dr. Birdie Sons is out of town at time of delivery.

## 2012-08-10 NOTE — Patient Instructions (Signed)
Trabajo de parto y parto normal (Normal Labor and Delivery) En primer lugar, su mdico debe estar seguro de que usted est en trabajo de parto. Algunos signos son:  Puede haber eliminado el "tapn mucoso" antes que comience el trabajo de parto. Se trata de una pequea cantidad de mucus con sangre.  Tiene contracciones uterinas regulares.  El tiempo entre las contracciones se acorta.  Las molestias y el dolor se hacen gradualmente ms intensos.  El dolor se ubica principalmente en la espalda.  Los dolores empeoran al caminar.  El cuello del tero (la apertura del tero se hace ms delgada, comienza a borrarse, y se abre (se dilata). Una vez que se encuentre en trabajo de parto y sea admitida en el hospital, el mdico har lo siguiente:  Un examen fsico completo.  Controlar sus signos vitales (presin arterial, pulso, temperatura y la frecuencia cardaca fetal).  Realizar un examen vaginal (usando un guante estril y lubricante para determinar:  La posicin (presentacin) del beb (ceflica [vertex] o nalgas primero).  El nivel (plano) de la cabeza del beb en el canal de parto.  El borramiento y dilatacin del cuello del tero.  Le rasurarn el vello pbico y le aplicarn una enema segn lo considere el mdico y las circunstancias.  Generalmente se coloca un monitor electrnico sobre el abdomen. El monitor sigue la duracin e intensidad de las contracciones, as como la frecuencia cardaca del beb.  Generalmente, el profesional inserta una va intravenosa en el brazo para administrarle agua azucarada. Esta es una medida de precaucin, de modo que puedan administrarle rpidamente medicamentos durante el trabajo de parto. EL TRABAJO DE PARTO Y PARTO NORMALES SE DIVIDEN EN 3 ETAPAS: Primera etapa Comienzan las contracciones regulares y el cuello comienza a borrarse y dilatarse. Esta etapa puede durar entre 3 y 15 horas. El final de la primera etapa se considera cuando el cuello  est borrado en un 100% y se ha dilatado 10 cm. Le administrarn analgsicos por:  Inyeccin (morfina, demerol, etc.).  Anestesia regional (espinal, caudal o epidural, anestsicos colocados en diferentes regiones de la columna vertebral). Podrn administrarle medicamentos para el dolor en la regin paracervical, que consiste en la aplicacin de un anestsico inyectable en cada uno de los lados del cuello del tero. La embarazada puede requerir un "parto natural" , es decir no recibir medicamentos o anestesia durante el trabajo de parto y el parto. Segunda etapa En este momento el beb baja a travs del canal de parto (vagina) y nace. Esto puede durar entre 1 y 4 horas. A medida que el beb asoma la cabeza por el canal de parto, podr sentir una sensacin similar a cuando mueve el intestino. Sentir el impulse de empujar con fuerza hasta que el nio salga. A medida que la cabecita baja, el mdico decidir si realiza una episiotoma (corte en el perineo y rea de la vagina) para evitar la ruptura de los tejidos). Luego del nacimiento del beb y la expulsin de la placenta, la episiotoma se sutura. En algunos casos se coloca a la madre una mscara con xido nitroso para facilitar la respiracin y aliviar el dolor. El final de la etapa 2 se produce cuando el beb ha salido completamente. Luego, cuando el cordn umbilical deja de pulsar, se pinza y se corta. Tercera etapa La tercera etapa comienza luego que el beb ha nacido y finaliza luego de la expulsin de la placenta. Generalmente esto lleva entre 5 y 30 minutos. Luego de la expulsin de la   placenta, le aplicarn un medicamento por va intravenosa para ayudar a contraer el tero y prevenir hemorragias. En la tercera etapa no hay dolor y generalmente no son necesarios los analgsicos. Si le han realizado una episiotoma, es el momento de repararla. Luego del parto, la mam es observada y controlada exhaustivamente durante 1  2 horas para verificar que no  hay sangrado en el post parto (hemorragias). Si pierde mucha sangre, le administrarn un medicamento para contraer el tero y detener la hemorragia. Document Released: 04/04/2008 Document Revised: 07/15/2011 Exit[employer] Patient Information 2013 [employer].  

## 2012-08-10 NOTE — Addendum Note (Signed)
Addended by: [place], Olie Dibert on: 08/10/2012 02:53 PM   Modules accepted: Orders

## 2012-08-10 NOTE — Assessment & Plan Note (Addendum)
Patient with symptoms of carpal tunnel, which occurs more frequently in pregnancy. Plan: will place in cock up splint, advised to wear this as much as tolerated for help with symptoms.

## 2012-08-17 ENCOUNTER — Ambulatory Visit (INDEPENDENT_AMBULATORY_CARE_PROVIDER_SITE_OTHER): Payer: Self-pay | Admitting: Family Medicine

## 2012-08-17 VITALS — BP 116/74 | Temp 98.4°F | Wt 155.8 lb

## 2012-08-17 DIAGNOSIS — Z3483 Encounter for supervision of other normal pregnancy, third trimester: Secondary | ICD-10-CM

## 2012-08-17 DIAGNOSIS — Z3480 Encounter for supervision of other normal pregnancy, unspecified trimester: Secondary | ICD-10-CM

## 2012-08-17 MED ORDER — LORATADINE 10 MG PO TABS: 10.0000 mg | ORAL_TABLET | Freq: Every day | ORAL | Status: DC

## 2012-08-17 NOTE — Progress Notes (Signed)
Colleen Thornton is a 27 y.o. G3P2002 at [redacted]w[redacted]d with h/o GDM and pre-eclampsia here for routine prenatal care.  S: Complains of 2 three-minute episodes of LLQ spaced 5 minutes apart a few days ago that did not recur. Denies vaginal bleeding, gush of fluid, or discontinuation of fetal movement. Also reports allergies (nasal congestion, sneezing, throat pain) with no fever or other complaints. Denies dysuria. O: See flowsheet. On speculum exam, cervix appears pink and closed.  CV: RRR PULM: Normal effort A/P: Pregnancy at [redacted]w[redacted]d doing well.  -Collected GBS and GC/Chlamydia today -upper respiratory irritation - most likely allergies. Rx for loratadine sent to pharmacy. -Wanted more information on IUD and BTL - provided spanish information -Feeding preference: breast and bottle -F/u in 1 week with Dr. Birdie Sons, at which point check if pt has received TDAP (pt was unsure). -Discussed return precautions

## 2012-08-17 NOTE — Patient Instructions (Addendum)
Mucho gusto y felicitaciones! Todo parece bien hoy.  Regresa en 1 semana para un visito con Dr. Birdie Sons. Si el no esta disponible, conmigo.  Puede tratar loratidine para alergias. Estoy enviando esta receta a su farmacia pero pienso que puede comprarlo sin receta. Voy a dar informacion sobre tipos de anticonceptivo. Los laboratorios que hemos collectado hoy - si esta anormal, llamare a ud. Si esta normal, enviare la informacion a la casa.  Si tiene Tyson Foods fuerte y frecuente, sangre vaginal, rompe la fuente, o su bebe para moviendo, regresa a [facility] para un chequeo.  Karl Pock, Dra Simone Curia   Informacin sobre el dispositivo intrauterino  (Intrauterine Device Information) El dispositivo intrauterino (DIU) se inserta en el tero e impide el embarazo. Hay dos tipos de DIU:   DIU de cobre. Este tipo de DIU est recubierto con un alambre de cobre y se inserta dentro del tero. El cobre hace que el tero y las trompas de Falopio produzcan un liquido que [employer] Department Stores espermatozoides. El DIU de cobre puede Geneticist, molecular durante 10 aos.  DIU hormonal. Este tipo de DIU contiene la hormona progestina (progesterona sinttica). La hormona espesa el moco cervical y evita que los espermatozoides ingresen al tero y tambin afina la membrana que cubre el tero para evitar la implantacin del vulo fertilizado. La hormona debilita o destruye los espermatozoides que ingresan al tero. El DIU hormonal puede Geneticist, molecular durante 5 aos. El mdico se asegurar de que usted es una buena candidata para usar el DIU cono anticonceptivo. Hable con su mdico acerca de los posibles efectos secundarios.  VENTAJAS  Es muy eficaz, reversible, de accin prolongada y de bajo mantenimiento.  No hay efectos secundarios relacionados con el estrgeno.  El DIU puede ser utilizado durante la Market researcher.  No est asociado con el aumento de [place].  Funciona  inmediatamente despus de la insercin.  El DIU de cobre no interfiere con las hormonas femeninas.  El DIU con progesterona puede hacer que los perodos menstruales no sean tan abundantes.  El DIU de progesterona puede usarse durante 5 aos.  El DIU de cobre puede usarse durante 10 aos. DESVENTAJAS  El DIU de progesterona puede estar asociado con patrones de sangrado irregular.  El DIU de cobre puede hacer que el flujo menstrual ms abundante y doloroso.  Puede experimentar clicos y sangrado vaginal despus de la insercin. Document Released: 10/10/2009 Document Revised: 07/15/2011 [facility] Patient Information 2013 [place], [place].   Informacin sobre Engineer, civil (consulting)  (Sterilization Information, Female) La esterilizacin en la mujer es un procedimiento que se realiza para Location manager de [place]. Hay diferentes formas de Futures trader, Biomedical engineer en todos los casos se bloquean o se cierran las trompas de [place] para que vulos no puedan llegar al tero. Si el vulo no llega al tero, los espermatozoides no fertilizan el vulo, y usted no podr [place].  La esterilizacin se lleva a cabo por medio de un procedimiento quirrgico. A veces, estos procedimientos se realizan en el hospital haciendo dormir a Education officer, community. En otros casos se realiza en un consultorio en una clnica y la paciente permanece despierta. Las trompas de Nordstrom se pueden cortar, Public affairs consultant o sellar quirrgicamente, con un procedimiento que se llama ligadura de trompas. Otro mtodo es cerrarlas con clips o anillos. La esterilizacin tambin puede realizarse mediante la colocacin de un pequeo resorte en cada trompa de [place], lo que hace que se desarrolle tejido  cicatrizal en el interior de la trompa. Luego el tejido Verizon.  Comente el tema con su mdico para responder las inquietudes que usted o su pareja puedan Warehouse manager. Podr preguntarle a su mdico  qu tipo de Diplomatic Services operational officer. Algunos profesionales pueden no realizar todas las opciones. La esterilizacin es permanente y slo debe hacerse si est segura de que no desea tener hijos o no desea tener ms hijos. La reversin de la esterilizacin puede no tener xito.  PROCEDIMIENTOS PARA LA ESTERILIZACIN   Esterilizacin laparoscpica. Es un mtodo quirrgico que se Biomedical engineer en otro momento que no es inmediatamente despus del [place]. Se realizan dos incisiones en la zona baja del abdomen. Un tubo delgado con una fuente de luz (laparoscopio) se inserta en una de las incisiones y se [place] para Surveyor, quantity. Las trompas de [place] se cierran con un anillo o un clip. Podrn utilizar un instrumento que utiliza el calor para sellar las trompas (electrocauterizacin)..   Mini-laparotoma. Es un mtodo quirrgico que se Biomedical engineer 1 o 2 das despus del [place]. En general, consiste en una pequea incisin que se hace justo debajo del (ombligo) por el cual se visualizan las trompas de [place]. Las trompas pueden ser selladas, atadas o cortadas.   Esterilizacin histeroscpica. Esto se realiza en otro momento que no sea inmediatamente despus del parto. Se coloca un pequeo resorte helicoidal a travs del cuello y del cuerpo del tero y se inserta en las trompas de [place]. El resorte produce cicatrizacin y [employer] trompas. Se deben utilizar otras formas de anticoncepcin durante 3 meses despus del procedimiento para permitir que el tejido cicatrizal se forme completamente. Adems, es necesario hacer una histerosalpingografa despus de 3 meses para asegurarse de que el procedimiento ha sido exitoso.  La histerosalpingografa es un procedimiento en el que utilizan rayos X para observar el tero y las trompas de Falopio despus de insertar un dispositivo, para asegurarse de que est bien colocado. LA ESTERILIZACIN ES UN PROCEDIMIENTO SEGURO?  La esterilizacin se considera un  procedimiento seguro en el que rara vez se producen complicaciones. Los riesgos dependen del tipo de procedimiento que se realicen. Al igual que con cualquier procedimiento quirrgico, puede haber riesgos. Algunos son:   Heron Nay.  Infeccin.  Reaccin a la anestesia.  Lesin en los rganos circundantes. Los riesgos especficos de la colocacin de los resortes por histeroscopa son:   No se Production designer, theatre/television/film.   Las resortes pueden salirse del Environmental consultant.   Las trompas no quedan completamente bloqueadas despus de 3 meses.   Ocurre una lesin en los rganos adyacentes al colocarlo.  LA ESTERILIZACIN ES UN MTODO EFECTIVO?  La esterilizacin es efectiva en casi 100% , pero puede fallar. Segn el tipo de esterilizacin, el porcentaje de fracaso puede ser tan alto como en un 3%. Despus de la esterilizacin histeroscpica con colocacin de un resorte en las trompas de [place], Pension scheme manager un mtodo anticonceptivo de respaldo durante 3 meses. La esterilizacin es efectiva durante toda la vida.  LOS BENEFICIOS DE LA ESTERILIZACIN   No afecta las hormonas, y por lo tanto no afectar sus perodos menstruales, el deseo o el rendimiento sexual, .   Es efectiva para toda la vida.   Es segura.   No tiene que preocuparse por Location manager. Tenga en cuenta que si fue sometida a este procedimiento, debe esperar 3 meses (o hasta que el mdico lo confirme) antes de considerar que no quedar embarazada.  No hay efectos secundarios a diferencia de otros tipos de control de la natalidad (contracepcin).  INCONVENIENTES DE LA ESTERILIZACIN   Usted debe estar seguro de que no desea tener hijos o ms hijos. El procedimiento es [place].   No ofrece proteccin contra las infecciones de transmisin sexual (ITS).   Las trompas Hess Corporation a Engineer, building services. Si esto ocurre, habr riesgo de embarazo. Tambin hay un mayor riesgo (50%) que el embarazo sea ectpico.  Se llama as al embarazo que ocurre fuera del tero. Document Released: 10/09/2007 Document Revised: 10/22/2011 [facility] Patient Information 2013 [place], [place].

## 2012-08-19 LAB — STREP B DNA PROBE: GBSP: NEGATIVE

## 2012-08-24 ENCOUNTER — Encounter: Payer: Self-pay | Admitting: Family Medicine

## 2012-08-25 ENCOUNTER — Ambulatory Visit (INDEPENDENT_AMBULATORY_CARE_PROVIDER_SITE_OTHER): Payer: No Typology Code available for payment source | Admitting: Family Medicine

## 2012-08-25 VITALS — BP 122/74 | Temp 98.3°F | Wt 156.0 lb

## 2012-08-25 DIAGNOSIS — Z3480 Encounter for supervision of other normal pregnancy, unspecified trimester: Secondary | ICD-10-CM

## 2012-08-25 DIAGNOSIS — Z3483 Encounter for supervision of other normal pregnancy, third trimester: Secondary | ICD-10-CM

## 2012-08-25 NOTE — Patient Instructions (Signed)
Nice to see you again. You are doing great. If you develop contractions, loss of fluid, bleeding, or decrease in fetal movement please go to women's hospital.  Colleen Thornton trimestre  (Pregnancy - Third Trimester) El tercer trimestre del embarazo (los ltimos 3 meses) es el perodo en el cual tanto usted como su beb crecen con ms rapidez. El beb alcanza un largo de aproximadamente 50 cm. y pesa entre 2,700 y 4,500 kg. El beb gana ms tejido graso y est listo para la vida fuera del cuerpo de la [place]. Mientras estn en el interior, los bebs tienen perodos de sueo y vigilia, Warehouse manager y tienen hipo. Quizs sienta pequeas contracciones del tero. Este es el falso trabajo de [place]. Tambin se las conoce como contracciones de [place] . Es como una prctica del parto. Los problemas ms habituales de esta etapa del embarazo incluyen mayor dificultad para respirar, hinchazn de las manos y los pies por retencin de lquidos y la necesidad de Geographical information systems officer con ms frecuencia debido a que el tero y el beb presionan sobre la vejiga.  EXAMENES PRENATALES   Durante los Manpower Inc, deber seguir realizndose anlisis de [place]. Estas pruebas se realizan para controlar su salud y la del beb. Los ARAMARK Corporation de sangre se Radiographer, therapeutic para [employer] niveles de algunos compuestos de la sangre (hemoglobina). La anemia (bajo nivel de hemoglobina) es frecuente durante el embarazo. Para prevenirla, se administran hierro y vitaminas. Tambin le tomarn nuevas anlisis para descartar diabetes. Podrn repetirle algunas de las Hovnanian Enterprises hicieron previamente.  En cada visita le medirn el tamao del tero. Esto permite asegurar que el beb se desarrolla adecuadamente, segn la fecha del embarazo.  Le controlarn la presin arterial en cada visita prenatal. Esto es para asegurarse de que no sufre toxemia.  Le harn un anlisis de orina en cada visita prenatal, para descartar infecciones,  diabetes y la presencia de protenas.  Tambin en cada visita controlarn su peso. Esto se realiza para asegurarse que aumenta de peso al ritmo indicado y que usted y su beb evolucionan normalmente.  En algunas ocasiones se realiza una prueba de ultrasonido para confirmar el correcto desarrollo y evolucin del beb. Esta prueba se realiza con ondas sonoras inofensivas para el beb, de modo que el profesional pueda calcular ms precisamente la fecha del [place].  Analice con su mdico los analgsicos y la anestesia que recibir durante el [place] de parto y [place].  Comente la posibilidad de que necesite una cesrea y qu anestesia se recibir.  Informe a su mdico si sufre violencia familiar mental o fsica. A veces, se indica la prueba especializada sin estrs, la prueba de tolerancia a las contracciones y el perfil biofsico para asegurarse de que el beb no tiene problemas. El estudio del lquido amnitico que rodea al beb se llama amniocentesis. El lquido amnitico se obtiene introduciendo una aguja en el vientre (abdomen ). En ocasiones se lleva a cabo cerca del final del embarazo, si es necesario inducir a un parto. En este caso se realiza para asegurarse que los pulmones del beb estn lo suficientemente maduros como para que pueda vivir fuera del tero. Si los pulmones no han madurado y es peligroso que el beb nazca, se Building services engineer a la madre una inyeccin de [place] , 1 a 2 [address]  [address] antes del [address]. Vivia Budge ayuda a que los pulmones del beb maduren y sea ms seguro su nacimiento.  CAMBIOS QUE OCURREN EN EL TERCER TRIMESTRE DEL [facility]  Su organismo atravesar numerosos cambios Academic librarian. Estos pueden variar de Neomia Dear persona a otra. Converse con el profesional que la asiste acerca los cambios que usted note y que la preocupen.   Durante el ltimo trimestre probablemente sienta un aumento del apetito. Es normal tener "antojos" de Development worker, community. Esto vara de Neomia Dear persona a otra y de  un embarazo a Therapist, art.  Podrn aparecer las primeras estras en las caderas, abdomen y [place]. Estos son cambios normales del cuerpo durante el [place]. No existen medicamentos ni ejercicios que puedan prevenir CarMax.  La constipacin puede tratarse con un laxante o agregando fibra a su dieta. Beber grandes cantidades de lquidos, tomar fibras en forma de vegetales, frutas y granos integrales es de gran [place].  Tambin es beneficioso practicar actividad fsica. Si ha sido una persona Engineer, mining, podr continuar con la Harley[employer]Davidson de las actividades durante el mismo. Si ha sido [employer] Family Insurance, puede ser beneficioso que comience con un programa de ejercicios, Museum/gallery exhibitions officer. Consulte con el profesional que la asiste antes de comenzar un programa de ejercicios.  Evite el consumo de cigarrillos, el alcohol, los medicamentos no recetados y las "drogas de la calle" durante el Psychiatrist. Estas sustancias qumicas afectan la formacin y el desarrollo del beb. Evite estas sustancias durante todo el embarazo para asegurar el nacimiento de un beb sano.  Podr sentir dolor de espalda, tener vrices en las venas y hemorroides, o si ya los sufra, pueden [place].  Durante el tercer trimestre se cansar con ms facilidad, lo cual es normal.  Los movimientos del beb pueden ser ms fuertes y con ms frecuencia.  Puede que note dificultades para respirar normalmente.  El ombligo puede salir hacia afuera.  A veces sale Veterinary surgeon de las [place], que se llama Product manager.  Podr aparecer Neomia Dear secrecin mucosa con sangre. Esto suele ocurrir General Electric unos [address] y Neomia Dear semana antes del [place]. INSTRUCCIONES PARA EL CUIDADO EN EL HOGAR   Cumpla con las citas de control. Siga las indicaciones del mdico con respecto al uso de [place], los ejercicios y la dieta.  Durante el embarazo debe obtener nutrientes para usted y para su beb. Consuma alimentos balanceados a intervalos  regulares. Elija alimentos como carne, pescado, [place] y otros productos lcteos descremados, vegetales, frutas, panes integrales y cereales. El Office Depot informar cul es el aumento de peso ideal.  Las relaciones sexuales pueden continuarse hasta casi el final del embarazo, si no se presentan otros problemas como prdida prematura (antes de [place]) de lquido amnitico, hemorragia vaginal o dolor en el vientre (abdominal).  Realice Tesoro Corporation, si no tiene restricciones. Consulte con el profesional que la asiste si no sabe con certeza si determinados ejercicios son seguros. El mayor aumento de peso se producir en los ltimos 2 trimestres del Psychiatrist. El ejercicio ayuda a:  Engineering geologist.  Mantenerse en forma para el trabajo de parto y [place] .  Perder peso despus del parto.  Haga reposo con frecuencia, con las piernas elevadas, o segn lo necesite para evitar los calambres y el dolor de cintura.  Use un buen sostn o como los que se usan para hacer deportes para Paramedic la sensibilidad de las [place]. Tambin puede serle til si lo [place] mientras duerme. Si pierde Product manager, podr Parker Hann[employer]in.  No utilice la baera con agua caliente, baos turcos y saunas.  Colquese el cinturn de seguridad cuando conduzca. Este la proteger  a usted y al beb en caso de accidente.  Evite comer carne cruda y el contacto con los utensilios y desperdicios de los gatos. Estos elementos contienen grmenes que pueden causar defectos de nacimiento en el beb.  Es fcil perder algo de orina durante el [place]. Apretar y Chief Operating Officer los msculos de la pelvis la ayudar con este problema. Practique detener la miccin cuando est en el bao. Estos son los mismos msculos que Development worker, international aid. Son TEPPCO Partners mismos msculos que utiliza cuando trata de evitar despedir gases. Puede practicar apretando estos msculos WellPoint, y repetir esto tres veces por da  aproximadamente. Una vez que conozca qu msculos debe apretar, no realice estos ejercicios durante la miccin. Puede favorecerle una infeccin si la orina vuelve hacia atrs.  Pida ayuda si tienen necesidades financieras, teraputicas o nutricionales. El profesional podr ayudarla con respecto a estas necesidades, o derivarla a otros especialistas.  Haga una lista de nmeros telefnicos de emergencia y tngalos disponibles.  Planifique como obtener ayuda de familiares o amigos cuando regrese a Programmer, applications hospital.  Hacer un ensayo sobre la partida al hospital.  [place] clases prenatales con el padre para entender, practicar y hacer preguntas sobre el [place] de parto y el alumbramiento.  Preparar la habitacin del beb / busque Fatima Blank.  No viaje fuera de la ciudad a menos que sea absolutamente necesario y con el asesoramiento de su mdico.  Use slo zapatos de tacn bajo o sin tacn para tener mejor equilibrio y Automotive engineer cadas. USO DE MEDICAMENTOS Y CONSUMO DE DROGAS DURANTE EL [facility]   Tome las vitaminas apropiadas para esta etapa tal como se le indic. Las vitaminas deben contener un miligramo de cido flico. Guarde todas las vitaminas fuera del alcance de los nios. La ingestin de slo un par de vitaminas o tabletas que contengan hierro pueden ocasionar la Newmont Mining en un beb o en un nio pequeo.  Evite el uso de The Mutual of Omaha, incluyendo hierbas, medicamentos de [place], sin receta o que no hayan sido sugeridos por su mdico. Slo tome medicamentos de venta libre o medicamentos recetados para Chief Technology Officer, Environmental health practitioner o fiebre como lo indique su mdico. No tome aspirina, ibuprofeno (Motrin, Advil, Nuprin) o naproxeno (Aleve) excepto que su mdico se lo indique.  Infrmele al profesional si consume alguna droga.  El alcohol se relaciona con ciertos defectos congnitos. Incluye el sndrome de alcoholismo fetal. Debe evitar absolutamente el consumo de alcohol, en  cualquier forma. El fumar produce baja tasa de natalidad y bebs prematuros.  Las drogas ilegales o de la calle son muy perjudiciales para el beb. Estn absolutamente prohibidas. Un beb que nace de American Express, ser adicto al nacer. Ese beb tendr los mismos sntomas de abstinencia que un adulto. SOLICITE ATENCIN MDICA SI:  Tiene preguntas o preocupaciones relacionadas con el embarazo. Es mejor que llame para formular las preguntas si no puede esperar hasta la prxima visita, que sentirse preocupada por ellas.  DECISIONES ACERCA DE LA CIRCUNCISIN  Usted puede saber o no cul es el sexo de su beb. Si ya sabe que ser un varn, este es el momento de pensar acerca de la circuncisin. La circuncisin es la extirpacin del prepucio. Esta es la piel que cubre el extremo sensible del pene. No hay un motivo mdico que lo justifique. Generalmente la decisin se toma segn lo que sea popular en ese momento, o segn creencias religiosas. Podr conversar estos temas con su mdico o con el pediatra.  SOLICITE ATENCIN MDICA DE INMEDIATO SI:   La temperatura oral le sube a ms de 102 F (38.9 C) o lo que su mdico le indique.  Tiene una prdida de lquido por la vagina (canal de parto). Si sospecha una ruptura de las [place], tmese la temperatura y llame al profesional para informarlo sobre esto.  Observa unas pequeas manchas, una hemorragia vaginal o elimina cogulos. Notifique al profesional acerca de la cantidad y de cuntos apsitos est utilizando.  Presenta un olor desagradable en la secrecin vaginal y observa un cambio en el color, de transparente a blanco.  Ha vomitado durante ms de 24 horas.  Siente escalofros o le sube la fiebre.  Le falta el aire.  Siente ardor al Beatrix Shipper.  Baja o sube ms de 2 libras (900 g), o segn lo indicado por el profesional que la asiste.  Observa que sbitamente se le hinchan el rostro, las manos, los pies o las piernas.  Siente dolor en el  vientre (abdominal). Las Federal-Mogul en el ligamento redondo son Neomia Dear causa benigna frecuente de dolor abdominal durante el embarazo. El profesional que la asiste deber evaluarla.  Presenta dolor de cabeza intenso que no se [place].  Tiene problemas visuales, visin doble o borrosa.  Si no siente los movimientos del beb durante ms de 1 hora. Si piensa que el beb no se mueve tanto como lo haca habitualmente, coma algo que Psychologist, clinical y Target Corporation lado izquierdo durante [place]. El beb debe moverse al menos 4  5 veces por hora. Comunquese inmediatamente si el beb se mueve menos que lo indicado.  Se cae, se ve involucrada en un accidente automovilstico o sufre algn tipo de traumatismo.  En su hogar hay violencia mental o fsica. Document Released: 01/30/2005 Document Revised: 10/22/2011 [facility] Patient Information 2013 [place], [place].

## 2012-08-25 NOTE — Progress Notes (Signed)
Colleen Thornton is a 27 y.o. G3P2002 at [redacted]w[redacted]d for routine follow up.  She reports no contractions, LOF, bleeding, discharge. Reports good fetal movement with movement every hours. States has had headache over the past week. Denies blurry vision and RUQ pain. Carpal tunnel symptoms feeling better.  See flow sheet for details.  A/P: Pregnancy at [redacted]w[redacted]d.  Doing well.   Pregnancy issues include previous history of GDM and pre-eclampsia with normal glucola and BPs during this pregnancy. Urine protein and glucose negative on 08/10/12 Infant feeding choice breast and bottle Contraception choice to f/u on patient decision at next visit. Previously given info on IUD and BTL.  Infant circumcision desired not applicable GBS/GC/CZ results were reviewed today and negative.   Labor precautions reviewed. To f/u in one week.

## 2012-09-01 ENCOUNTER — Ambulatory Visit (INDEPENDENT_AMBULATORY_CARE_PROVIDER_SITE_OTHER): Payer: No Typology Code available for payment source | Admitting: Family Medicine

## 2012-09-01 VITALS — BP 126/70 | Temp 99.0°F | Wt 160.0 lb

## 2012-09-01 DIAGNOSIS — Z3490 Encounter for supervision of normal pregnancy, unspecified, unspecified trimester: Secondary | ICD-10-CM

## 2012-09-01 DIAGNOSIS — Z331 Pregnant state, incidental: Secondary | ICD-10-CM

## 2012-09-01 MED ORDER — RANITIDINE HCL 150 MG PO TABS: 150.0000 mg | ORAL_TABLET | Freq: Two times a day (BID) | ORAL | Status: DC

## 2012-09-01 NOTE — Progress Notes (Signed)
Colleen Thornton is a 27 y.o. G3P2002 at [redacted]w[redacted]d for routine follow up.  She reports continued reflux that is helped with zantac. Also reports continued carpal tunnel symptoms and pain in her fore arms worse in morning.  See flow sheet for details. FHT present.  A/P: Pregnancy at [redacted]w[redacted]d.  Doing well.   Pregnancy issues include previous history of GDM and prior history of preeclampsia.  Refilled zantac. Advised to obtain additional splint for second wrist for carpal tunnel symptoms.  Infant feeding choice breast and bottle. Contraception choice IUD. Infant circumcision not applicable GBS/GC/CZ results were reviewed today.   Labor precautions reviewed. Kick counts reviewed. To f/u in one week.

## 2012-09-01 NOTE — Patient Instructions (Signed)
Nice to see you again. Please continue to use the brace for your wrist and arm pain. I refilled your zantac. When you go into labor please make sure the staff at the hospital contacts me.  Ihor Dow y parto normal (Normal Labor and Delivery) Licensed conveyancer, su mdico debe estar seguro de que usted est en trabajo de [place]. Algunos signos son:  Puede haber eliminado el "tapn mucoso" antes que comience el trabajo de [place]. Se trata de una pequea cantidad de mucus con sangre.  Tiene contracciones uterinas regulares.  El [employer] las contracciones se acorta.  Las molestias y Chief Technology Officer se hacen gradualmente ms intensos.  El dolor se ubica principalmente en la espalda.  Los dolores empeoran al [employer].  El cuello del tero (la apertura del tero se hace ms delgada, comienza a borrarse, y se abre (se dilata). Una vez que se encuentre en [place] parto y sea admitida en el hospital, el mdico har lo siguiente:  Un examen fsico completo.  Controlar sus signos vitales (presin arterial, pulso, temperatura y la frecuencia cardaca fetal).  Realizar un examen vaginal (usando un guante estril y lubricante para determinar:  La posicin (presentacin) del beb (ceflica [vertex] o nalgas primero).  El nivel (plano) de la cabeza del beb en el canal de parto.  El borramiento y dilatacin del cuello del tero.  Le rasurarn el vello pbico y le aplicarn una enema segn lo considere el mdico y las circunstancias.  Generalmente se coloca un monitor electrnico sobre el abdomen. El monitor sigue la duracin e intensidad de las contracciones, as como la frecuencia cardaca del beb.  Generalmente, el profesional inserta una va intravenosa en el brazo para administrarle agua azucarada. Esta es una medida de precaucin, de modo que puedan administrarle rpidamente medicamentos durante el [place]. EL TRABAJO DE PARTO Y PARTO NORMALES SE DIVIDEN EN 3  ETAPAS: Primera etapa Comienzan las contracciones regulares y el cuello comienza a borrarse y dilatarse. Esta etapa puede durar entre 3 y 15 horas. El final de la primera etapa se considera cuando el cuello est borrado en un 100% y se ha dilatado 10 cm. Le administrarn analgsicos por:  Inyeccin (morfina, demerol, etc.).  Anestesia regional (espinal, caudal o epidural, anestsicos colocados en diferentes regiones de la columna vertebral). Podrn administrarle medicamentos para el dolor en la regin paracervical, que consiste en la aplicacin de un anestsico inyectable en cada uno de los lados del cuello del tero. La embarazada puede requerir un "parto natural" , es decir no recibir [employer] o anestesia durante el [place] de parto y [place]. Segunda etapa En este momento el beb baja a travs del canal de parto (vagina) y nace. Esto puede durar entre 1 y 4 horas. A medida que el beb asoma la cabeza por el canal de parto, podr sentir una sensacin similar a cuando mueve el intestino. Sentir el impulse de empujar con fuerza hasta que el nio salga. A medida que la cabecita baja, el mdico decidir si realiza una episiotoma (corte en el perineo y rea de la vagina) para evitar la ruptura de los tejidos). Luego del nacimiento del beb y la expulsin de la placenta, la episiotoma se sutura. En algunos casos se coloca a la madre una mscara con xido nitroso para Research officer, political party respiracin y Engineer, materials. El final de la etapa 2 se produce cuando el beb ha salido completamente. Luego, cuando el cordn umbilical deja de pulsar, se pinza y  se corta. Tercera etapa La tercera etapa comienza luego que el beb ha nacido y finaliza luego de la expulsin de la placenta. Generalmente esto lleva entre 5 y 30 minutos. Luego de la expulsin de la placenta, le aplicarn un medicamento por va intravenosa para ayudar a Engineer, materials y Psychiatric nurse. En la tercera etapa no hay dolor y generalmente  no son necesarios los analgsicos. Si le han realizado una episiotoma, es el momento de Sales promotion account executive. Luego del parto, la mam es observada y controlada exhaustivamente durante 1  2 horas para verificar que no hay sangrado en el post parto (hemorragias). Si pierde [employer], le administrarn un medicamento para Engineer, manufacturing tero y Comptroller. Document Released: 04/04/2008 Document Revised: 07/15/2011 [facility] Patient Information 2013 [place], [place].

## 2012-09-08 ENCOUNTER — Ambulatory Visit (INDEPENDENT_AMBULATORY_CARE_PROVIDER_SITE_OTHER): Payer: No Typology Code available for payment source | Admitting: Family Medicine

## 2012-09-08 VITALS — BP 125/80 | Temp 98.3°F | Wt 157.0 lb

## 2012-09-08 DIAGNOSIS — Z3480 Encounter for supervision of other normal pregnancy, unspecified trimester: Secondary | ICD-10-CM

## 2012-09-08 DIAGNOSIS — Z3490 Encounter for supervision of normal pregnancy, unspecified, unspecified trimester: Secondary | ICD-10-CM

## 2012-09-08 NOTE — Patient Instructions (Addendum)
Todo esta bien hoy con su embarazo.  Continua tomando vitaminas prenatales. Regresa en 1 semana para su proximo visito. Hacemos cita para induccion si no ha tenido bebe en [employer]. Si tiene contracciones frecuente y intenso, sangre vaginal, se rompe la fuente, o la bebe para moviendo, ir a [facility] inmediatamente.

## 2012-09-08 NOTE — Progress Notes (Signed)
S: Complains of contractions Monday lasting 2 minutes and remitting spontaneously, itching arms and legs, and some leg cramping yesterday. Denies vaginal bleeding or gush of fluid. Reports fetal movement.  O: See flowsheet. LE: No calf tenderness, only mild 1+ pitting edema, no asymmetry PULM: Normal effort HEENT: Sclera clear SKIN: No jaundice A/P: Pregnancy at [redacted]w[redacted]d. Doing well.  Pregnancy issues include previous history of GDM and prior history of preeclampsia.  Likely cholestasis of pregnancy - monitor. Infant feeding choice breast and bottle.  Contraception choice IUD.  Infant circumcision not applicable  Labor precautions reviewed.  Kick counts reviewed.  To f/u in one week, at which time will schedule induction.

## 2012-09-15 ENCOUNTER — Ambulatory Visit (INDEPENDENT_AMBULATORY_CARE_PROVIDER_SITE_OTHER): Payer: No Typology Code available for payment source | Admitting: *Deleted

## 2012-09-15 ENCOUNTER — Ambulatory Visit (INDEPENDENT_AMBULATORY_CARE_PROVIDER_SITE_OTHER): Payer: No Typology Code available for payment source | Admitting: Family Medicine

## 2012-09-15 VITALS — BP 119/65

## 2012-09-15 VITALS — BP 133/84 | Wt 159.9 lb

## 2012-09-15 DIAGNOSIS — O480 Post-term pregnancy: Secondary | ICD-10-CM

## 2012-09-15 DIAGNOSIS — Z3483 Encounter for supervision of other normal pregnancy, third trimester: Secondary | ICD-10-CM

## 2012-09-15 DIAGNOSIS — Z3480 Encounter for supervision of other normal pregnancy, unspecified trimester: Secondary | ICD-10-CM

## 2012-09-15 NOTE — Patient Instructions (Addendum)
Todo esta bien hoy!  Estamos haciendo cita para la induccion. Tambien estamos haciendo cita para Surveyor, quantity [employer] veces este semana antes de la induccion. Shanda Bumps va a dar las citas a Ud.  Entre hoy y su induccion, si tiene contracciones muy fuerte y frecuente, sangre vaginal, ha rompido la fuente, o su bebe para moviendo, ir a [facility] inmediatamente.  Regresa aqui para su cita 6 semanas despues del parto.

## 2012-09-15 NOTE — Progress Notes (Signed)
S: No complaints. Sunday 1 contraction 5 minutes that was not very strong. Denies vaginal bleeding or gush of clear fluid like water breaking. Feels baby moving lots. Pt wants cervical check today. Reports some leg swelling, no vision changes, headaches, or RUQ pain.  O: See flowsheet GEN: NAD EXTR: No LE edema ABD: not tender HEENT: Sclera clear; SKIN: No jaundice GU: Cervical exam: no tenderness; 1.5cm dilated, head vertex, not ballotable  A/P: G3P2002 at [redacted]w[redacted]d. Doing well. Pregnancy issues include previous history of GDM and prior history of preeclampsia.  Likely cholestasis of pregnancy - monitor.  Infant feeding choice breast and bottle.  Contraception choice IUD.  Infant circumcision not applicable  Labor precautions reviewed.  Kick counts reviewed.  - Schedule BPP and NST today 5/13 and Friday 5/16 - induction scheduled for 730PM Saturday 5/17 - BP - Continue to monitor, no LE edema, headache, vision change, or abdominal pain so not concerning for pre-eclampsia

## 2012-09-15 NOTE — Progress Notes (Signed)
[id number] 

## 2012-09-16 ENCOUNTER — Encounter (HOSPITAL_COMMUNITY): Payer: Self-pay

## 2012-09-16 ENCOUNTER — Encounter (HOSPITAL_COMMUNITY): Payer: Self-pay | Admitting: Anesthesiology

## 2012-09-16 ENCOUNTER — Inpatient Hospital Stay (HOSPITAL_COMMUNITY): Payer: Medicaid Other | Admitting: Anesthesiology

## 2012-09-16 ENCOUNTER — Inpatient Hospital Stay (HOSPITAL_COMMUNITY)
Admission: AD | Admit: 2012-09-16 | Discharge: 2012-09-18 | DRG: 774 | Disposition: A | Discharge status: Home / Self Care | Payer: Medicaid Other | Source: Ambulatory Visit | Attending: Obstetrics & Gynecology | Admitting: Obstetrics & Gynecology

## 2012-09-16 DIAGNOSIS — D696 Thrombocytopenia, unspecified: Secondary | ICD-10-CM

## 2012-09-16 DIAGNOSIS — O99814 Abnormal glucose complicating childbirth: Secondary | ICD-10-CM

## 2012-09-16 DIAGNOSIS — Z3483 Encounter for supervision of other normal pregnancy, third trimester: Secondary | ICD-10-CM

## 2012-09-16 DIAGNOSIS — D689 Coagulation defect, unspecified: Principal | ICD-10-CM | POA: Diagnosis not present

## 2012-09-16 DIAGNOSIS — O9913 Other diseases of the blood and blood-forming organs and certain disorders involving the immune mechanism complicating the puerperium: Secondary | ICD-10-CM

## 2012-09-16 HISTORY — DX: Gestational diabetes mellitus in pregnancy, unspecified control: O24.419

## 2012-09-16 LAB — CBC
HCT: 38.7 % (ref 36.0–46.0)
HCT: 36.7 % (ref 36.0–46.0)
Hemoglobin: 13.9 g/dL (ref 12.0–15.0)
MCH: 32.8 pg (ref 26.0–34.0)
MCH: 31.9 pg (ref 26.0–34.0)
MCH: 32.4 pg (ref 26.0–34.0)
MCHC: 35 g/dL (ref 30.0–36.0)
MCHC: 35.7 g/dL (ref 30.0–36.0)
MCV: 90.8 fL (ref 78.0–100.0)
Platelets: 87 10*3/uL — ABNORMAL LOW (ref 150–400)
Platelets: 88 10*3/uL — ABNORMAL LOW (ref 150–400)
RBC: 4.24 MIL/uL (ref 3.87–5.11)
RBC: 3.92 MIL/uL (ref 3.87–5.11)
RDW: 13.2 % (ref 11.5–15.5)
RDW: 13.1 % (ref 11.5–15.5)
WBC: 8.4 10*3/uL (ref 4.0–10.5)

## 2012-09-16 LAB — COMPREHENSIVE METABOLIC PANEL
AST: 39 U/L — ABNORMAL HIGH (ref 0–37)
Albumin: 2 g/dL — ABNORMAL LOW (ref 3.5–5.2)
BUN: 8 mg/dL (ref 6–23)
Calcium: 8.8 mg/dL (ref 8.4–10.5)
Creatinine, Ser: 0.59 mg/dL (ref 0.50–1.10)
Total Protein: 4.9 g/dL — ABNORMAL LOW (ref 6.0–8.3)

## 2012-09-16 LAB — ABO/RH: ABO/RH(D): O POS

## 2012-09-16 LAB — RPR: RPR Ser Ql: NONREACTIVE

## 2012-09-16 LAB — PROTEIN / CREATININE RATIO, URINE: Creatinine, Urine: 13.76 mg/dL

## 2012-09-16 LAB — TYPE AND SCREEN
ABO/RH(D): O POS
Antibody Screen: NEGATIVE

## 2012-09-16 MED ORDER — IBUPROFEN 600 MG PO TABS: 600.0000 mg | ORAL_TABLET | Freq: Four times a day (QID) | ORAL | Status: DC

## 2012-09-16 MED ORDER — SIMETHICONE 80 MG PO CHEW: 80.0000 mg | CHEWABLE_TABLET | ORAL | Status: DC | PRN

## 2012-09-16 MED ORDER — DIBUCAINE 1 % RE OINT
1.0000 "application " | TOPICAL_OINTMENT | RECTAL | Status: DC | PRN
  Filled 2012-09-16: qty 28

## 2012-09-16 MED ORDER — ACETAMINOPHEN 325 MG PO TABS: 650.0000 mg | ORAL_TABLET | ORAL | Status: DC | PRN

## 2012-09-16 MED ORDER — LIDOCAINE HCL (PF) 1 % IJ SOLN
30.0000 mL | INTRAMUSCULAR | Status: DC | PRN
  Filled 2012-09-16: qty 30

## 2012-09-16 MED ORDER — TETANUS-DIPHTH-ACELL PERTUSSIS 5-2.5-18.5 LF-MCG/0.5 IM SUSP
0.5000 mL | Freq: Once | INTRAMUSCULAR | Status: AC
  Administered 2012-09-18: 0.5 mL via INTRAMUSCULAR

## 2012-09-16 MED ORDER — OXYCODONE-ACETAMINOPHEN 5-325 MG PO TABS
1.0000 | ORAL_TABLET | ORAL | Status: DC | PRN
  Administered 2012-09-16 – 2012-09-17 (×4): 2 via ORAL
  Administered 2012-09-18: 1 via ORAL
  Filled 2012-09-16 (×2): qty 2
  Filled 2012-09-16: qty 1
  Filled 2012-09-16 (×2): qty 2

## 2012-09-16 MED ORDER — DIPHENHYDRAMINE HCL 50 MG/ML IJ SOLN: 12.5000 mg | INTRAMUSCULAR | Status: DC | PRN

## 2012-09-16 MED ORDER — FENTANYL CITRATE 0.05 MG/ML IJ SOLN: 100.0000 ug | Freq: Once | INTRAMUSCULAR | Status: DC

## 2012-09-16 MED ORDER — EPHEDRINE 5 MG/ML INJ
10.0000 mg | INTRAVENOUS | Status: DC | PRN
  Filled 2012-09-16: qty 2

## 2012-09-16 MED ORDER — ONDANSETRON HCL 4 MG/2ML IJ SOLN: 4.0000 mg | INTRAMUSCULAR | Status: DC | PRN

## 2012-09-16 MED ORDER — FENTANYL 2.5 MCG/ML BUPIVACAINE 1/10 % EPIDURAL INFUSION (WH - ANES)
14.0000 mL/h | INTRAMUSCULAR | Status: DC | PRN
  Administered 2012-09-16: 12 mL/h via EPIDURAL
  Filled 2012-09-16: qty 125

## 2012-09-16 MED ORDER — OXYTOCIN BOLUS FROM INFUSION: 500.0000 mL | INTRAVENOUS | Status: DC

## 2012-09-16 MED ORDER — ACETAMINOPHEN 325 MG PO TABS: 650.0000 mg | ORAL_TABLET | Freq: Four times a day (QID) | ORAL | Status: DC | PRN

## 2012-09-16 MED ORDER — LACTATED RINGERS IV SOLN
INTRAVENOUS | Status: DC
  Administered 2012-09-16 (×3): via INTRAVENOUS

## 2012-09-16 MED ORDER — PHENYLEPHRINE 40 MCG/ML (10ML) SYRINGE FOR IV PUSH (FOR BLOOD PRESSURE SUPPORT)
80.0000 ug | PREFILLED_SYRINGE | INTRAVENOUS | Status: DC | PRN
  Filled 2012-09-16: qty 2

## 2012-09-16 MED ORDER — MORPHINE SULFATE 4 MG/ML IJ SOLN
INTRAMUSCULAR | Status: AC
  Administered 2012-09-16: 2 mg
  Filled 2012-09-16: qty 1

## 2012-09-16 MED ORDER — PHENYLEPHRINE 40 MCG/ML (10ML) SYRINGE FOR IV PUSH (FOR BLOOD PRESSURE SUPPORT)
80.0000 ug | PREFILLED_SYRINGE | INTRAVENOUS | Status: DC | PRN
  Filled 2012-09-16: qty 5
  Filled 2012-09-16: qty 2

## 2012-09-16 MED ORDER — IBUPROFEN 600 MG PO TABS: 600.0000 mg | ORAL_TABLET | Freq: Four times a day (QID) | ORAL | Status: DC | PRN

## 2012-09-16 MED ORDER — ONDANSETRON HCL 4 MG/2ML IJ SOLN: 4.0000 mg | Freq: Four times a day (QID) | INTRAMUSCULAR | Status: DC | PRN

## 2012-09-16 MED ORDER — CITRIC ACID-SODIUM CITRATE 334-500 MG/5ML PO SOLN: 30.0000 mL | ORAL | Status: DC | PRN

## 2012-09-16 MED ORDER — OXYCODONE-ACETAMINOPHEN 5-325 MG PO TABS: 1.0000 | ORAL_TABLET | ORAL | Status: DC | PRN

## 2012-09-16 MED ORDER — BENZOCAINE-MENTHOL 20-0.5 % EX AERO
1.0000 "application " | INHALATION_SPRAY | CUTANEOUS | Status: DC | PRN
  Administered 2012-09-16: 1 via TOPICAL
  Filled 2012-09-16 (×2): qty 56

## 2012-09-16 MED ORDER — LACTATED RINGERS IV SOLN
500.0000 mL | Freq: Once | INTRAVENOUS | Status: AC
  Administered 2012-09-16: 500 mL via INTRAVENOUS

## 2012-09-16 MED ORDER — WITCH HAZEL-GLYCERIN EX PADS: 1.0000 "application " | MEDICATED_PAD | CUTANEOUS | Status: DC | PRN

## 2012-09-16 MED ORDER — PRENATAL MULTIVITAMIN CH
1.0000 | ORAL_TABLET | Freq: Every day | ORAL | Status: DC
  Administered 2012-09-17: 1 via ORAL
  Filled 2012-09-16: qty 1

## 2012-09-16 MED ORDER — MORPHINE SULFATE 4 MG/ML IJ SOLN: 2.0000 mg | Freq: Once | INTRAMUSCULAR | Status: AC

## 2012-09-16 MED ORDER — LANOLIN HYDROUS EX OINT: TOPICAL_OINTMENT | CUTANEOUS | Status: DC | PRN

## 2012-09-16 MED ORDER — ONDANSETRON HCL 4 MG PO TABS
4.0000 mg | ORAL_TABLET | ORAL | Status: DC | PRN
  Administered 2012-09-16: 4 mg via ORAL
  Filled 2012-09-16: qty 1

## 2012-09-16 MED ORDER — LACTATED RINGERS IV SOLN: 500.0000 mL | INTRAVENOUS | Status: DC | PRN

## 2012-09-16 MED ORDER — SENNOSIDES-DOCUSATE SODIUM 8.6-50 MG PO TABS
2.0000 | ORAL_TABLET | Freq: Every day | ORAL | Status: DC
  Administered 2012-09-16: 2 via ORAL

## 2012-09-16 MED ORDER — OXYTOCIN 40 UNITS IN LACTATED RINGERS INFUSION - SIMPLE MED
62.5000 mL/h | INTRAVENOUS | Status: DC
  Administered 2012-09-16: 999 mL/h via INTRAVENOUS
  Filled 2012-09-16: qty 1000

## 2012-09-16 MED ORDER — EPHEDRINE 5 MG/ML INJ
10.0000 mg | INTRAVENOUS | Status: DC | PRN
  Filled 2012-09-16: qty 2
  Filled 2012-09-16: qty 4

## 2012-09-16 MED ORDER — OXYTOCIN 40 UNITS IN LACTATED RINGERS INFUSION - SIMPLE MED
INTRAVENOUS | Status: AC
  Administered 2012-09-16: 40 [IU]
  Filled 2012-09-16: qty 1000

## 2012-09-16 MED ORDER — LIDOCAINE HCL (PF) 1 % IJ SOLN
INTRAMUSCULAR | Status: DC | PRN
  Administered 2012-09-16: 4 mL
  Administered 2012-09-16: 2 mL
  Administered 2012-09-16 (×2): 4 mL

## 2012-09-16 MED ORDER — ZOLPIDEM TARTRATE 5 MG PO TABS: 5.0000 mg | ORAL_TABLET | Freq: Every evening | ORAL | Status: DC | PRN

## 2012-09-16 MED ORDER — LACTATED RINGERS IV BOLUS (SEPSIS)
500.0000 mL | Freq: Once | INTRAVENOUS | Status: AC
  Administered 2012-09-16: 500 mL via INTRAVENOUS

## 2012-09-16 MED ORDER — DIPHENHYDRAMINE HCL 25 MG PO CAPS: 25.0000 mg | ORAL_CAPSULE | Freq: Four times a day (QID) | ORAL | Status: DC | PRN

## 2012-09-16 NOTE — Assessment & Plan Note (Signed)
See vitals and notes for plan.

## 2012-09-16 NOTE — H&P (Signed)
Colleen Thornton is a 27 y.o. G3P2002 at [redacted]w[redacted]d who presents complaining of contractions. The ctx are coming every 5 minutes and are painful.  For prenatal care pt goes to [facility] (sees Dr. Birdie Sons and Dr. Benjamin Stain). Denies having any problems during this pregnancy.  Had two prior vaginal deliveries. The second one was complicated by gestational diabetes. Pt would like an epidural.  History OB History   Grav Para Term Preterm Abortions TAB SAB Ect Mult Living   3 2 2       2      No past medical history on file. No past surgical history on file. Family History: family history includes Diabetes in her father and paternal grandmother and Hyperlipidemia in her sister. Social History:  reports that she has never smoked. She does not have any smokeless tobacco history on file. She reports that she does not drink alcohol or use illicit drugs.   Prenatal Transfer Tool  Maternal Diabetes: No Genetic Screening: Normal Maternal Ultrasounds/Referrals: Normal Fetal Ultrasounds or other Referrals:  None Maternal Substance Abuse:  No Significant Maternal Medications:  Meds include: Zantac Significant Maternal Lab Results:  Lab values include: Group B Strep negative Other Comments:  None  ROS +contractions  Blood pressure 126/77, pulse 61, temperature 98.7 F (37.1 C), temperature source Oral, resp. rate 18, height 5' 0.25" (1.53 m), weight 160 lb (72.576 kg), last menstrual period 12/07/2011, SpO2 99.00%. Exam Physical Exam  Gen: NAD Heart: RRR Lungs: CTAB, NWOB Abd: gravid but otherwise soft, nontender to palpation Ext: no appreciable lower extremity edema bilaterally Neuro: grossly nonfocal, speech intact GU: normal appearing external genitalia Dilation: 4.5 Effacement (%): 90 Cervical Position: Middle Station: -1 Presentation: Vertex Exam by:: Colleen Liter RN  Prenatal labs: ABO, Rh: O/POS/-- (11/01 1056) Antibody: NEG (11/01 1056) Rubella: >500.0  (11/01 1056) RPR: NON REAC (03/05 1444)  HBsAg: NEGATIVE (11/01 1056)  HIV: NON REACTIVE (03/05 1444)  GBS: NEGATIVE (04/14 1632)   FHR: baseline 130s, moderate variability, no accels, no decels Toco: irregular ctx q2-5 min   Assessment/Plan: Colleen Thornton is a 27 y.o. G3P2002 at [redacted]w[redacted]d who presents in active labor. From chart review cervix was 1.5cm dilated yesterday at prenatal visit, and has made change since then.  -Admit to L&D for expectant management -GBS negative -Plans for epidural -Anticipate NSVD -Dr. Benjamin Stain aware that pt is being admitted   Levert Feinstein 09/16/2012, 6:24 AM  I have seen and examined this patient and I agree with the above. Cam Hai 8:52 AM 09/16/2012

## 2012-09-16 NOTE — Anesthesia Postprocedure Evaluation (Signed)
Anesthesia Post Note  Patient: Colleen Thornton  Procedure(s) Performed: * No procedures listed *  Anesthesia type: Epidural  Patient location: Mother/Baby  Post pain: Pain level controlled  Post assessment: Post-op Vital signs reviewed  Last Vitals: BP 104/67  Pulse 68  Temp(Src) 37.2 C (Oral)  Resp 18  Ht 5' 0.25" (1.53 m)  Wt 160 lb (72.576 kg)  BMI 31 kg/m2  SpO2 98%  LMP 12/07/2011  Post vital signs: Reviewed  Level of consciousness: awake  Complications: No apparent anesthesia complications

## 2012-09-16 NOTE — Anesthesia Preprocedure Evaluation (Signed)
Anesthesia Evaluation  Patient identified by MRN, date of birth, ID band Patient awake    Reviewed: Allergy & Precautions, H&P , NPO status , Patient's Chart, lab work & pertinent test results, reviewed documented beta blocker date and time   History of Anesthesia Complications Negative for: history of anesthetic complications  Airway Mallampati: III TM Distance: >3 FB Neck ROM: full    Dental  (+) Teeth Intact   Pulmonary neg pulmonary ROS,  breath sounds clear to auscultation        Cardiovascular negative cardio ROS  Rhythm:regular Rate:Normal     Neuro/Psych negative neurological ROS  negative psych ROS   GI/Hepatic Neg liver ROS, GERD-  Medicated,  Endo/Other  negative endocrine ROSneg diabetes (prior pregnancy)  Renal/GU negative Renal ROS     Musculoskeletal   Abdominal   Peds  Hematology  (+) Blood dyscrasia (thrombocytopenia), ,   Anesthesia Other Findings   Reproductive/Obstetrics (+) Pregnancy                           Anesthesia Physical Anesthesia Plan  ASA: II  Anesthesia Plan: Epidural   Post-op Pain Management:    Induction:   Airway Management Planned:   Additional Equipment:   Intra-op Plan:   Post-operative Plan:   Informed Consent: I have reviewed the patients History and Physical, chart, labs and discussed the procedure including the risks, benefits and alternatives for the proposed anesthesia with the patient or authorized representative who has indicated his/her understanding and acceptance.     Plan Discussed with:   Anesthesia Plan Comments:         Anesthesia Quick Evaluation

## 2012-09-16 NOTE — MAU Note (Signed)
Contractions q 5 minutes

## 2012-09-16 NOTE — Anesthesia Procedure Notes (Signed)
Epidural Patient location during procedure: OB Start time: 09/16/2012 7:31 AM  Staffing Performed by: anesthesiologist   Preanesthetic Checklist Completed: patient identified, site marked, surgical consent, pre-op evaluation, timeout performed, IV checked, risks and benefits discussed and monitors and equipment checked  Epidural Patient position: sitting Prep: site prepped and draped and DuraPrep Patient monitoring: continuous pulse ox and blood pressure Approach: midline Injection technique: LOR air  Needle:  Needle type: Tuohy  Needle gauge: 17 G Needle length: 9 cm and 9 Needle insertion depth: 6.5 cm Catheter type: closed end flexible Catheter size: 19 Gauge Catheter at skin depth: 11.5 cm Test dose: negative  Assessment Events: blood not aspirated, injection not painful, no injection resistance, negative IV test and no paresthesia  Additional Notes Discussed risk of headache, infection, bleeding, nerve injury and failed or incomplete block.  Patient voices understanding and wishes to proceed.  Epidural placed easily on first attempt.  No paresthesia.  Patient tolerated procedure well with no apparent complications.  Jasmine December, MD

## 2012-09-16 NOTE — Progress Notes (Signed)
Patient ID: Colleen Thornton, female   DOB: 12-27-1985, 27 y.o.   MRN: [id number]   S:  Called to room for patient bleeding, feeling nauseous, hot, SOB, weak. Has had continued trickling of blood with each fundal check. Fundus sometimes slightly boggy per RN.  Val Eagle: Filed Vitals:   09/16/12 1146 09/16/12 1201 09/16/12 1248 09/16/12 1400  BP: 139/79 140/72 164/75 107/68  Pulse: 54 49 51 61  Temp:   98.3 F (36.8 C) 98.3 F (36.8 C)  TempSrc:   Oral Oral  Resp: 18 18 18 18   Height:      Weight:      SpO2:    98%    Drop from lying to standing 147 - 134 - 107 SBP  EXAM:   GEN:  Pt looks ill-appearing, no distress CV:  Bradycardia (48 bpm), no murmur RESP:  CTAB ABD:  Fundus firm, at umbilicus, non-tender Extrem:  Warm, well perfused GU:  Bimanual exam - lower uterine segment somewhat soft but no clots, minimal blood out with exam.  A/P 27 y.o. [id number] s/p SVD around 11 AM - continued slow trickle of blood, total since delivery around 200, continue pitocin - Vitals stable, not quite orthostatic - Bradycardia - monitor, not severe - Low platelets, elevated BP earlier - will get PIH labs and monitor platelets - Stable for now. Watch closely. 500 ml bolus LR.   Napoleon Form, MD

## 2012-09-17 LAB — CBC
Hemoglobin: 12.1 g/dL (ref 12.0–15.0)
MCH: 31.7 pg (ref 26.0–34.0)
MCV: 92.1 fL (ref 78.0–100.0)
Platelets: 93 10*3/uL — ABNORMAL LOW (ref 150–400)
RBC: 3.82 MIL/uL — ABNORMAL LOW (ref 3.87–5.11)
WBC: 7.4 10*3/uL (ref 4.0–10.5)

## 2012-09-17 NOTE — Progress Notes (Signed)
Ur chart review completed.  

## 2012-09-17 NOTE — Progress Notes (Signed)
Post Partum Day 1  Subjective: up ad lib, voiding, + flatus and not tolerating PO yet but going to try this morning. Vomited last night. Bleeding like period now (decreased from yesterday); no difficulty breathing, no abnormal lochia. Denies vision changes, reports brief episode of RUQ pain and headache yesterday that has resolved  Objective: Blood pressure 108/69, pulse 71, temperature 98 F (36.7 C), temperature source Oral, resp. rate 20, height 5' 0.25" (1.53 m), weight 160 lb (72.576 kg), last menstrual period 12/07/2011, SpO2 98.00%, unknown if currently breastfeeding.  Physical Exam:  General: alert, cooperative, appears stated age and no distress Lochia: appropriate Uterine Fundus: firm DVT Evaluation: No evidence of DVT seen on physical exam. No cords or calf tenderness. No significant calf/ankle edema.   Recent Labs  09/16/12 1643 09/17/12 0620  HGB 13.1 12.1  HCT 36.7 35.2*    Assessment/Plan: Plan for discharge tomorrow and Contraception planning IUD Bleeding: Monitor. 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 Will recheck at 6 wk follow up Blood pressure: Continue to monitor. If remains elevated, recheck UP:C. Urine protein yesterday <4 and UP:C unable to be calculated.  - If elevated tomorrow, consider starting antihypertensive and checking BP in office in 1 week    LOS: 1 day   Simone Curia 09/17/2012, 8:56 AM

## 2012-09-17 NOTE — Progress Notes (Signed)
Post Partum Day 1 Subjective: no complaints, up ad lib, voiding and tolerating PO  Objective: Blood pressure 108/69, pulse 71, temperature 98 F (36.7 C), temperature source Oral, resp. rate 20, height 5' 0.25" (1.53 m), weight 160 lb (72.576 kg), last menstrual period 12/07/2011, SpO2 98.00%, unknown if currently breastfeeding.  Physical Exam:  General: alert, cooperative and no distress Lochia: appropriate Uterine Fundus: firm Incision: n/a DVT Evaluation: No evidence of DVT seen on physical exam.   Recent Labs  09/16/12 1643 09/17/12 0620  HGB 13.1 12.1  HCT 36.7 35.2*    Assessment/Plan: Plan for discharge tomorrow unless Dr Demetrius Charity. Determines she is ready for discharge today   LOS: 1 day   [facility] 09/17/2012, 7:55 AM

## 2012-09-17 NOTE — Anesthesia Postprocedure Evaluation (Addendum)
Anesthesia Post Note  Patient: Colleen Thornton  Procedure(s) Performed: * No procedures listed *Anesthesia type: Epidural.Patient location: Mother/Baby  Post pain: Pain level controlled  Post assessment: Post-op Vital signs reviewed. Platelet  count 93. Epidural removed, tip intact - tolerated well.  Last Vitals: BP 108/69  Pulse 71  Temp(Src) 36.7 C (Oral)  Resp 20  Ht 5' 0.25" (1.53 m)  Wt 160 lb (72.576 kg)  BMI 31 kg/m2  SpO2 98%  LMP 12/07/2011  Post vital signs: Reviewed  Level of consciousness: awake  Complications: No apparent anesthesia complications

## 2012-09-18 ENCOUNTER — Other Ambulatory Visit: Payer: No Typology Code available for payment source

## 2012-09-18 MED ORDER — SENNOSIDES-DOCUSATE SODIUM 8.6-50 MG PO TABS: 2.0000 | ORAL_TABLET | Freq: Every day | ORAL | Status: DC | PRN

## 2012-09-18 MED ORDER — ACETAMINOPHEN 325 MG PO TABS: 650.0000 mg | ORAL_TABLET | Freq: Four times a day (QID) | ORAL | Status: DC | PRN

## 2012-09-18 MED ORDER — OXYCODONE-ACETAMINOPHEN 5-325 MG PO TABS: 1.0000 | ORAL_TABLET | Freq: Four times a day (QID) | ORAL | Status: DC | PRN

## 2012-09-18 NOTE — Discharge Summary (Signed)
Obstetric Discharge Summary Reason for Admission: onset of labor Prenatal Procedures: ultrasound Intrapartum Procedures: spontaneous vaginal delivery Postpartum Procedures: none Complications-Operative and Postpartum: hemorrhage and thrombocytopenia CBC    Component Value Date/Time   WBC 7.4 09/17/2012 0620   RBC 3.82* 09/17/2012 0620   HGB 12.1 09/17/2012 0620   HCT 35.2* 09/17/2012 0620   PLT 93* 09/17/2012 0620   MCV 92.1 09/17/2012 0620   MCH 31.7 09/17/2012 0620   MCHC 34.4 09/17/2012 0620   RDW 13.3 09/17/2012 0620   LYMPHSABS 1.8 03/06/2012 1056   MONOABS 0.3 03/06/2012 1056   EOSABS 0.1 03/06/2012 1056   BASOSABS 0.0 03/06/2012 1056   Physical Exam:  General: alert, cooperative and no distress Lochia: appropriate Uterine Fundus: firm Incision: N/A DVT Evaluation: No evidence of DVT seen on physical exam.  Hospital Course: Colleen Thornton is a 27 y.o. G3P3003 at [redacted]w[redacted]d who was admitted to the hospital after presenting with contractions. Patient had a NSVD complicated by thrombocytopenia and PPH. Hemoglobin and Platelets stable at time of discharge.  Pt is bottle feeding and plans to use IUD for contraception. She will follow up with Family Medicine (Dr. Birdie Sons) in 6 weeks.   Discharge Diagnoses: Term Pregnancy-delivered  Discharge Information: Date: 09/18/2012 Activity: pelvic rest Diet: routine Medications: Percocet Condition: stable Instructions: refer to practice specific booklet Discharge to: home  Newborn Data: Live born female  Birth Weight: 7 lb 5.8 oz (3340 g) APGAR: 7, 9  Home with mother.  Colleen Thornton Other 09/18/2012, 7:54 AM  I saw and examined patient and agree with above resident note. I reviewed history, delivery summary, labs and vitals. Colleen Form, MD

## 2012-09-19 ENCOUNTER — Inpatient Hospital Stay (HOSPITAL_COMMUNITY): Admission: RE | Admit: 2012-09-19 | Payer: No Typology Code available for payment source | Source: Ambulatory Visit

## 2012-09-21 NOTE — Discharge Summary (Signed)
Attestation of Attending Supervision of Advanced Practitioner (CNM/NP): Evaluation and management procedures were performed by the Advanced Practitioner under my supervision and collaboration.  I have reviewed the Advanced Practitioner's note and chart, and I agree with the management and plan.  HARRAWAY-SMITH, Cortney Beissel 10:43 AM

## 2012-09-30 ENCOUNTER — Encounter: Payer: Self-pay | Admitting: Family Medicine

## 2012-09-30 ENCOUNTER — Ambulatory Visit (INDEPENDENT_AMBULATORY_CARE_PROVIDER_SITE_OTHER): Payer: No Typology Code available for payment source | Admitting: Family Medicine

## 2012-09-30 VITALS — BP 125/91 | HR 68 | Temp 98.4°F | Wt 137.0 lb

## 2012-09-30 DIAGNOSIS — R300 Dysuria: Secondary | ICD-10-CM

## 2012-09-30 DIAGNOSIS — N898 Other specified noninflammatory disorders of vagina: Secondary | ICD-10-CM

## 2012-09-30 LAB — POCT URINALYSIS DIPSTICK
Bilirubin, UA: NEGATIVE
Glucose, UA: NEGATIVE
Ketones, UA: NEGATIVE
Leukocytes, UA: NEGATIVE
Nitrite, UA: NEGATIVE
pH, UA: 6

## 2012-09-30 LAB — POCT UA - MICROSCOPIC ONLY

## 2012-09-30 LAB — POCT WET PREP (WET MOUNT): Clue Cells Wet Prep Whiff POC: NEGATIVE

## 2012-09-30 NOTE — Progress Notes (Signed)
Interpreter Wyvonnia Dusky for DR Cristal Ford

## 2012-09-30 NOTE — Assessment & Plan Note (Signed)
Pt is 2 weeks postpartum with one week of vague intermittent lower abdominal discomfort and mild right adnexal tenderness. There are no red flags to suggest endometritis, PID or systemic infection such appendicitis on exam. Urinalysis is negative for white cells. There is some lochia noted and wet prep is sent to the lab. No GI symptoms to suggest bowel disease. Possibly this is just some irritation from postpartum uterine changes. I had planned to obtain a pelvic ultrasound, but since patient is uninsured we've both agreed to postpone this for now and to clinically observe. She will follow up in 5 days with PCP. Discussed red flags to seek emergency care if needed.

## 2012-09-30 NOTE — Progress Notes (Signed)
  Subjective:    Patient ID: Colleen Thornton, female    DOB: 1985-07-19, 27 y.o.   MRN: [id number]  HPI  1. Lower abdominal discomfort/dysuria. 27 yo G3P3 at 2 weeks postpartum from uncomplicated vaginal delivery. She complains of dysuria, low back pain, pain with defecation for the past one week. This is intermittent in nature. She feels a burning in hotness with urination. She notes some dark brown and red lochia at times. Is not worsen than a typical period.  Denies fever, nausea, chills, severe bowel pain, emesis, diarrhea, blood in stool, blood in urine.  Last sexual activity was greater than 4 months ago. She did not have any surgeries or procedures in the hospital. No hx of UTI or pelvic infections.  Review of Systems See HPI otherwise negative.  reports that she has never smoked. She has never used smokeless tobacco.     Objective:   Physical Exam  Constitutional: She is oriented to person, place, and time. She appears well-developed and well-nourished. No distress.  HENT:  Head: Normocephalic and atraumatic.  Mouth/Throat: Oropharynx is clear and moist.  Cardiovascular: Normal rate, regular rhythm and normal heart sounds.   No murmur heard. Pulmonary/Chest: Effort normal and breath sounds normal. No respiratory distress. She has no wheezes.  Abdominal: Soft. Bowel sounds are normal. She exhibits no distension. There is no rebound and no guarding.  Mild suprapubic and general lower abdominal TTP. No rebound or guarding.  Genitourinary:  Vagina and rectum appear normal. Mucoid cervical discharge with some blood tinged. No cervical motion TTP. has slight right adnexal TTP. No masses appreciated.   Musculoskeletal: She exhibits no edema and no tenderness.  Neurological: She is alert and oriented to person, place, and time.  Skin: No rash noted. She is not diaphoretic.  Psychiatric: She has a normal mood and affect.        Assessment & Plan:

## 2012-09-30 NOTE — Patient Instructions (Addendum)
Get your pelvic Ultrasound. Will notify of results. Call for fever, nausea, worsened symptoms.

## 2012-10-07 ENCOUNTER — Encounter: Payer: Self-pay | Admitting: Family Medicine

## 2012-10-07 ENCOUNTER — Encounter: Payer: Self-pay | Admitting: *Deleted

## 2012-10-07 ENCOUNTER — Ambulatory Visit (INDEPENDENT_AMBULATORY_CARE_PROVIDER_SITE_OTHER): Payer: No Typology Code available for payment source | Admitting: Family Medicine

## 2012-10-07 VITALS — BP 110/58 | HR 73 | Temp 99.5°F | Ht 60.25 in | Wt 137.0 lb

## 2012-10-07 DIAGNOSIS — R109 Unspecified abdominal pain: Secondary | ICD-10-CM | POA: Insufficient documentation

## 2012-10-07 DIAGNOSIS — R300 Dysuria: Secondary | ICD-10-CM

## 2012-10-07 LAB — POCT URINALYSIS DIPSTICK
Glucose, UA: NEGATIVE
Leukocytes, UA: NEGATIVE
Protein, UA: NEGATIVE
Urobilinogen, UA: 0.2

## 2012-10-07 MED ORDER — METRONIDAZOLE 500 MG PO TABS: 500.0000 mg | ORAL_TABLET | Freq: Two times a day (BID) | ORAL | Status: DC

## 2012-10-07 MED ORDER — CEPHALEXIN 500 MG PO CAPS: 500.0000 mg | ORAL_CAPSULE | Freq: Three times a day (TID) | ORAL | Status: DC

## 2012-10-07 NOTE — Patient Instructions (Addendum)
Start taking antibiotics in case you have an infection. Sent to [facility].  If you get fever, nausea or worsened symptoms then call doctor. Make an appointment with Dr. Briant Sites in 1-2 weeks.

## 2012-10-07 NOTE — Assessment & Plan Note (Signed)
Urinalysis is negative. Exam does not show acute abdomen. Her symptoms are stable and mild, though some concern for a subclinical endometritis given her postpartum status. There is no sign of systemic or severe infection. Will treat empirically with Flagyl and Keflex for one week. She may use Tylenol for discomfort as needed. She agrees to followup in one to 2 weeks with her PCP or sooner if symptoms worsen or she develops nausea vomiting or fever.   Case precepted with Dr. Mauricio Po.

## 2012-10-07 NOTE — Progress Notes (Signed)
  Subjective:    Patient ID: Colleen Thornton, female    DOB: [birth year], 27 y.o.   MRN: [id number]  HPI  1. F/u lower abdominal pain. Patient follows up today for her complaints of lower abdominal cramping, dysuria and intermittent pain with defecation. She reports that her symptoms have not changed since the evaluation one week ago, no better and no worse. She is now 3 weeks post partum. She has intermittent dysuria and a constant cramping or stabbing in her right lower quadrant and sometimes to a lesser degree in the left lower quadrant. Her lochia has decreased but still experiences slight bleeding intermittently though not daily. Endorses a mild headache.  She denies any fever, chills, nausea, vomiting, cough, vaginal discharge, weight loss, back pain.  She has no sexual contact in greater than 4 months. She is breast-feeding about 15-20% of the time, otherwise bottlefeeding.  Review of Systems See HPI otherwise negative.  reports that she has never smoked. She has never used smokeless tobacco.     Objective:   Physical Exam  Vitals reviewed. Constitutional: She appears well-developed and well-nourished. No distress.  HENT:  Head: Normocephalic and atraumatic.  Mouth/Throat: Oropharynx is clear and moist. No oropharyngeal exudate.  Eyes: EOM are normal. Pupils are equal, round, and reactive to light.  Neck: Neck supple.  Cardiovascular: Normal rate, regular rhythm and normal heart sounds.   No murmur heard. Pulmonary/Chest: Effort normal and breath sounds normal. No respiratory distress. She has no wheezes. She has no rales.  Abdominal: Soft. Bowel sounds are normal. She exhibits no distension. There is no rebound and no guarding.  verbally endorses some mild bilateral lower quadrant tenderness, though exhibits no outward signs.  Musculoskeletal: She exhibits no edema and no tenderness.  Skin: She is not diaphoretic.  Psychiatric: She has a normal mood and affect.  Appears  well.          Assessment & Plan:

## 2012-10-07 NOTE — Progress Notes (Signed)
Interpreter Wyvonnia Dusky for Dr Cristal Ford

## 2012-10-14 ENCOUNTER — Telehealth: Payer: Self-pay | Admitting: Family Medicine

## 2012-10-14 NOTE — Telephone Encounter (Signed)
Pt canceled appt with [facility]   Marines

## 2012-10-14 NOTE — Telephone Encounter (Signed)
Thanks Marines :-) Mudlogger, [employer]

## 2012-10-15 NOTE — Telephone Encounter (Signed)
Thank you! Did she say why she had made it? Was she just confused?

## 2012-10-15 NOTE — Telephone Encounter (Signed)
No. She doesn't have an [place].  Marines

## 2012-10-27 NOTE — Progress Notes (Signed)
NKST 09-15-12 reviewed and reactive

## 2012-10-29 ENCOUNTER — Encounter: Payer: Self-pay | Admitting: Family Medicine

## 2012-10-29 ENCOUNTER — Ambulatory Visit (INDEPENDENT_AMBULATORY_CARE_PROVIDER_SITE_OTHER): Payer: No Typology Code available for payment source | Admitting: Family Medicine

## 2012-10-29 ENCOUNTER — Ambulatory Visit: Payer: No Typology Code available for payment source | Admitting: Advanced Practice Midwife

## 2012-10-29 NOTE — Assessment & Plan Note (Addendum)
Normal post partum course. Plan for IUD placement 11/13/12. Given precautions for return of care in AVS. Discussed these using spanish interpretor.

## 2012-10-29 NOTE — Patient Instructions (Signed)
Nice to see you again. Congratulations on the new baby. I'm glad your abdominal pain has improved. If it is to come back please come in to be seen. Your bleeding should continue to improve. You will eventually have a normal period. If you ever feel depressed please also let us know.

## 2012-10-29 NOTE — Progress Notes (Signed)
  Subjective:    Patient ID: Colleen Thornton, female    DOB: 14-Dec-1985, 27 y.o.   MRN: [id number]  HPI  Patient is a 27 yo female presenting for post-partum visit.  Recently seen in the office for abd pain and concern for subclinical endometritis. Was given keflex and flagyl. Pain has improved. Does not complain of any pain. No pain with eating or with BM. She has finished her course of antibiotics.  Is going to use IUD as contraception and has an appointment with Dr. Benjamin Stain for placement..  States has adjusted well to the new baby and her family is enjoying the baby as well. Felt slightly down in her mood the first 2 weeks of life of the baby, though this has improved and denies depression and decreased interest. Is breast and bottle feeding.  Endorses small amount of spotting intermittently, though this is improving.  Review of Systems see HPI     Objective:   Physical Exam  Constitutional: She appears well-developed and well-nourished.  HENT:  Head: Normocephalic and atraumatic.  Cardiovascular: Normal rate, regular rhythm and normal heart sounds.  Exam reveals no gallop and no friction rub.   No murmur heard. Pulmonary/Chest: Effort normal and breath sounds normal. She has no wheezes. She has no rales.  Abdominal: Soft. She exhibits no distension. There is tenderness (mild LLQ TTP per patient report, though no outward signs of pain). There is no rebound and no guarding.  Neurological: She is alert.  Skin: Skin is warm and dry.  BP 105/57  Pulse 70  Temp(Src) 99.7 F (37.6 C) (Oral)  Ht 5' 2.5" (1.588 m)  Wt 134 lb (60.782 kg)  BMI 24.1 kg/m2    Assessment & Plan:

## 2012-11-12 ENCOUNTER — Telehealth: Payer: Self-pay | Admitting: Family Medicine

## 2012-11-12 NOTE — Telephone Encounter (Signed)
Marines, could you please call patient to let her know tomorrow's IUD insertion will be like a pelvic / speculum exam but rougher, so I recommend she takes ibuprofen 6-800mg  PO once about an hour before coming in?  Thanks!  Leona Singleton, MD  11/12/2012 11:00 AM

## 2012-11-12 NOTE — Telephone Encounter (Signed)
I called pt and LVM.  Marines

## 2012-11-13 ENCOUNTER — Ambulatory Visit (INDEPENDENT_AMBULATORY_CARE_PROVIDER_SITE_OTHER): Payer: No Typology Code available for payment source | Admitting: Family Medicine

## 2012-11-13 ENCOUNTER — Encounter: Payer: Self-pay | Admitting: Family Medicine

## 2012-11-13 VITALS — BP 109/70 | HR 66 | Ht 60.5 in | Wt 136.0 lb

## 2012-11-13 DIAGNOSIS — Z309 Encounter for contraceptive management, unspecified: Secondary | ICD-10-CM | POA: Insufficient documentation

## 2012-11-13 LAB — POCT URINE PREGNANCY: Preg Test, Ur: NEGATIVE

## 2012-11-13 MED ORDER — LEVONORGESTREL 20 MCG/24HR IU IUD
INTRAUTERINE_SYSTEM | Freq: Once | INTRAUTERINE | Status: AC
  Administered 2012-11-13: 16:00:00 via INTRAUTERINE

## 2012-11-13 NOTE — Assessment & Plan Note (Addendum)
Mirena IUD placed today with no complications. - Strings visualized 4cm from os - Pt tolerated well and plans to use ibuprofen per AVS - Return after next period for string check. - Return precautions per AVS

## 2012-11-13 NOTE — Progress Notes (Addendum)
Patient ID: Colleen Thornton, female   DOB: 05-06-1986, 27 y.o.   MRN: [id number] Subjective:    CC: IUD insertion  HPI: Colleen Thornton is a 27 y.o. female here for IUD insertion.  Pt is interested in IUD insertion today and has taken ibuprofen prior to arrival. She is a little bit nervous about the procedure.  PMH: Pt delivered >6 weeks ago by NSVD.     Objective:  Physical Exam BP 109/70  Pulse 66  Ht 5' 0.5" (1.537 m)  Wt 136 lb (61.689 kg)  BMI 26.11 kg/m2 GEN: NAD, pleasant PULM: Nl effort GU: normal external vagina, bimanual exam cervix palpated, firm, closed, and easily palpated with no cervical motion tenderness; speculum exam cervix visualized and closed with small amount of mucus from cervix but otherwise no irregularity noted.   IUD Insertion Procedure Note Pre-operative Diagnosis: >6 weeks postpartum, requesting IUD. Post-operative Diagnosis: same Indications: contraception Procedure Details  - Urine pregnancy test was done 11/13/12 and result was negative.  The risks (including infection, bleeding, pain, and uterine perforation) and benefits of the procedure were explained to the patient and Written informed consent was obtained (scanned into chart).   - Cervix cleansed with Betadine. Uterus sounded to 7.5 cm. IUD inserted without difficulty. String visible and trimmed to 4cm. Patient tolerated procedure well. IUD Information: Mirena, Lot # [id number], 01/17 Condition: Stable Complications: None Plan: The patient was advised to call for any fever or for prolonged or severe pain or bleeding. She was advised to use NSAID as needed for mild to moderate pain.  Dr Armen Pickup attended and was present during entire procedure.  Assessment:     Colleen Thornton is a 27 y.o. female here for IUD insertion.    Plan:  # See problem list for problem-specific plans.

## 2012-11-13 NOTE — Patient Instructions (Addendum)
Ud recibio un anticonceptivo hoy. - Puede tener sangre vaginal para 1-2 dias. Si no quita o si tiene [address], [place]. Tambien si tiene fiebre, dolor que no se quite, o Librarian, academic. - Regresa despues de su proximo periodo para chequear el anticonceptivo. - Puede tomar ibuprofena para dolor 6-800mg  cada 4-6 horas.  - Relaja hoy. - [place] condones para 1-2 semanas.  Return after next period for string check.

## 2012-12-04 ENCOUNTER — Ambulatory Visit: Payer: No Typology Code available for payment source | Admitting: Family Medicine

## 2012-12-24 ENCOUNTER — Ambulatory Visit (INDEPENDENT_AMBULATORY_CARE_PROVIDER_SITE_OTHER): Payer: No Typology Code available for payment source | Admitting: Family Medicine

## 2012-12-24 ENCOUNTER — Encounter: Payer: Self-pay | Admitting: Family Medicine

## 2012-12-24 VITALS — BP 100/63 | HR 57 | Temp 98.4°F | Ht 60.5 in | Wt 136.0 lb

## 2012-12-24 DIAGNOSIS — Z309 Encounter for contraceptive management, unspecified: Secondary | ICD-10-CM

## 2012-12-24 MED ORDER — PRENATAL 27-0.8 MG PO TABS: 1.0000 | ORAL_TABLET | Freq: Every day | ORAL | Status: DC

## 2012-12-24 NOTE — Assessment & Plan Note (Signed)
IUD checked and in place, strings cut to 3-4 cm per pt request

## 2012-12-24 NOTE — Progress Notes (Signed)
Patient ID: Colleen Thornton, female   DOB: 1985-12-28, 27 y.o.   MRN: [id number] Subjective:   CC: IUD string check  HPI:   1. IUD check - Colleen Thornton is here for a string check. IUD placed with no complications 11/13/12 and strings  No concerns, wants strings trimmed to 4cm. Pt requests cutting strings if too long.  2. Medication question - Would also like prenatal vitamin re-prescribed because breastfeedinig and [facility] does not have that brand.  Review of Systems - Per HPI. Denies any issues with IUD, abnormal discharge, cramping, fevers, chills, abnormal bleeding.  PMH: - IUD recently placed 11/13/12    Objective:  Physical Exam BP 100/63  Pulse 57  Temp(Src) 98.4 F (36.9 C) (Oral)  Ht 5' 0.5" (1.537 m)  Wt 136 lb (61.689 kg)  BMI 26.11 kg/m2 GEN: NAD HEENT: Atraumatic, normocephalic, neck supple, EOMI, sclera clear  PULM: normal effort ABD: Soft, nondistended SKIN: No rash or cyanosis; warm and well-perfused PSYCH: Mood and affect euthymic, normal rate and volume of speech NEURO: Awake, alert, no focal deficits grossly, normal speech GU: Normal vaginal mucosa, cervix with small amount of brownish clear discharge, closed, with IUD strings at 5 cm. Cut to 3-4 cm. No complications.    Assessment:     Colleen Thornton is a 27 y.o. female with h/o recent IUD placement here for string check.    Plan:     # See problem list for problem-specific plans. - Rx sent to [facility] for different prenatal vit which she would still like to take.

## 2012-12-29 ENCOUNTER — Other Ambulatory Visit: Payer: Self-pay | Admitting: Family Medicine

## 2012-12-29 MED ORDER — PRENATAL PLUS 27-1 MG PO TABS: 1.0000 | ORAL_TABLET | Freq: Every day | ORAL | Status: DC

## 2013-01-15 ENCOUNTER — Ambulatory Visit (INDEPENDENT_AMBULATORY_CARE_PROVIDER_SITE_OTHER): Payer: No Typology Code available for payment source | Admitting: Family Medicine

## 2013-01-15 ENCOUNTER — Encounter: Payer: Self-pay | Admitting: Family Medicine

## 2013-01-15 VITALS — BP 121/76 | HR 65 | Temp 99.1°F | Wt 133.0 lb

## 2013-01-15 VITALS — BP 121/76 | HR 75 | Temp 99.1°F | Wt 133.0 lb

## 2013-01-15 DIAGNOSIS — K047 Periapical abscess without sinus: Secondary | ICD-10-CM

## 2013-01-15 MED ORDER — PENICILLIN V POTASSIUM 500 MG PO TABS: 500.0000 mg | ORAL_TABLET | Freq: Four times a day (QID) | ORAL | Status: DC

## 2013-01-15 MED ORDER — IBUPROFEN 600 MG PO TABS: 600.0000 mg | ORAL_TABLET | Freq: Four times a day (QID) | ORAL | Status: DC | PRN

## 2013-01-15 NOTE — Progress Notes (Signed)
See other office visit from today

## 2013-01-15 NOTE — Progress Notes (Deleted)
Patient ID: Colleen Thornton, female   DOB: 1985/07/05, 27 y.o.   MRN: [id number]  S:  - dental pain and face swelling and head pressure on the left side of teh face - started day before yesterday - worsened today - 10/10 pain - has a hx of a cavity on that side   No fevers, chills, nausea, vomiting, rashes. No difficulty breathing.   O:  Filed Vitals:   01/15/13 1054  BP: 121/76  Pulse: 65  Temp: 99.1 F (37.3 C)  TempSrc: Oral  Weight: 133 lb (60.328 kg)   Gen: NAD

## 2013-01-15 NOTE — Progress Notes (Signed)
Patient ID: Colleen Thornton, female   DOB: 1985/11/24, 27 y.o.   MRN: [id number]  S:  - dental pain and face swelling and head pressure on the left side of teh face  - started day before yesterday  - worsened today - 10/10 pain  - has a hx of a cavity on that side that was never treated years ago  No fevers, chills, nausea, vomiting, rashes. No difficulty breathing.   O:  Filed Vitals:    01/15/13 1054   BP:  121/76   Pulse:  65   Temp:  99.1 F (37.3 C)   TempSrc:  Oral   Weight:  133 lb (60.328 kg)   Gen: NAD HEENT: grey, decayed tooth (2nd incisor) with significant surrounding erythema and edema of the gum and cheek on the upper left.  Swelling of the left side of her face with some pre-auricular adenopathy and submandibular adenopathy   A/P Dental abscess  - rx of PCN and ibuprofen for pain as pt is breastfeeding.  - pt has an orange card so will attempt to get an urgent referral to dentistry given severity of infection - ok to use ice - soft foods - we will call her with dental appt when made - pt notified of these instructions via interpreter.

## 2013-01-15 NOTE — Patient Instructions (Signed)
Absceso dental  (Abscessed Tooth)  Un absceso dental es la acumulacin de lquido infectado (pus) debido a una infeccin bacteriana en la parte interna del diente (pulpa). Generalmente se produce en la punta de la raz del diente.  CAUSAS   Una cavidad extensa (caries dental grande).   Traumatismo en el diente, como un diente que se ha roto y permite el ingreso de las bacterias en la pulpa.  SNTOMAS   Dolor intenso en el interior y alrededor del diente infectado.   Hinchazn y enrojecimiento alrededor del diente, en la boca o en la cara.   Sensibilidad.   Secrecin de pus.   Mal aliento.   Gusto desagradable en la boca   Dificultad para tragar.   Dificultad para abrir Government social research officer.   Ganas de vomitar (nuseas).   Vmitos.   Escalofros.   Ganglios hinchados en el cuello.  DIAGNSTICO   Deben tomarle una historia clnica dental.   El examen consistir en punzar el absceso del diente.   Le tomarn radiografas del diente para identificar el absceso.  TRATAMIENTO  El objetivo del tratamiento es eliminar la infeccin.   Le indicarn antibiticos para evitar que la infeccin se extienda.   Para salvar el diente,el dentista har un tratamiento de conducto. Si el diente no puede salvarse, habr que sacarlo (extraerlo) y se drenar el absceso.  INSTRUCCIONES PARA EL CUIDADO EN EL HOGAR   Slo tome medicamentos de venta libre o de prescripcin para [employer], la fiebre, o el [place], segn las indicaciones de su mdico.   No conduzca mientras toma analgsicos(narcticos).Marguarite Arbour la boca con frecuencia(haga buches) con agua y sal ( de cucharadita de sal en 240 ml. de agua tibia) para aliviar el dolor y la inflamacin.   No aplique calor en la parte externa del rostro.   Regrese para completar el tratamiento con el dentista, segn las indicaciones  SOLICITE ATENCIN MDICA DE INMEDIATO SI:   La temperatura oral le sube a ms de 102 F (38.9 C), y no puede  bajarla con medicamentos.   Siente escalofros o un dolor de cabeza muy intenso.   Tiene problemas para respirar o tragar.   Tiene dificultad para abrir Government social research officer.   Observa hinchazn en el cuello o alrededor del ojo.   El dolor no se alivia con los [employer].   El dolor empeora en vez de [place].  Document Released: 01/30/2005 Document Revised: 04/11/2011 [facility] Patient Information 2012 [place], [place].

## 2013-01-20 ENCOUNTER — Ambulatory Visit: Payer: No Typology Code available for payment source

## 2013-03-08 ENCOUNTER — Ambulatory Visit (INDEPENDENT_AMBULATORY_CARE_PROVIDER_SITE_OTHER): Payer: No Typology Code available for payment source | Admitting: Family Medicine

## 2013-03-08 ENCOUNTER — Encounter: Payer: Self-pay | Admitting: Family Medicine

## 2013-03-08 VITALS — BP 106/69 | HR 71 | Temp 99.9°F | Ht 60.5 in | Wt 129.8 lb

## 2013-03-08 DIAGNOSIS — S025XXB Fracture of tooth (traumatic), initial encounter for open fracture: Secondary | ICD-10-CM

## 2013-03-08 DIAGNOSIS — S025XXA Fracture of tooth (traumatic), initial encounter for closed fracture: Secondary | ICD-10-CM

## 2013-03-08 DIAGNOSIS — K047 Periapical abscess without sinus: Secondary | ICD-10-CM

## 2013-03-08 MED ORDER — AMOXICILLIN-POT CLAVULANATE 875-125 MG PO TABS: 1.0000 | ORAL_TABLET | Freq: Two times a day (BID) | ORAL | Status: DC

## 2013-03-08 MED ORDER — TRAMADOL HCL 50 MG PO TABS: 50.0000 mg | ORAL_TABLET | Freq: Three times a day (TID) | ORAL | Status: DC | PRN

## 2013-03-08 NOTE — Progress Notes (Signed)
Subjective:    Colleen Thornton is a 27 y.o. female who presents to [facility] today for tooth pain:  1.  Tooth pain:  Patient has had dental abscess for several weeks, but now worse for past 3 days.  Noted a "blister" on upper jaw that burst yesterday with purulent material draining out.  Is eating and drinking well, but has to use the Right side of her jaw to chew.  No trouble with airway, no drooling.  Able to swallow easily.   Has restarted Penicillin for past several days, stopped this after last OV.  No relief, has noted increased swelling despite this use.   Taking Ibuprofen with only some relief of pain.    The following portions of the patient's history were reviewed and updated as appropriate: allergies, current medications, past medical history, family and social history, and problem list. Patient is a nonsmoker.    PMH reviewed.  Past Medical History  Diagnosis Date  . Gestational diabetes     G2 only - diet controlled   No past surgical history on file.  Medications reviewed. Current Outpatient Prescriptions  Medication Sig Dispense Refill  . acetaminophen (TYLENOL) 325 MG tablet Take 2 tablets (650 mg total) by mouth every 6 (six) hours as needed for pain.  30 tablet  0       0  . cephALEXin (KEFLEX) 500 MG capsule Take 1 capsule (500 mg total) by mouth 3 (three) times daily.  21 capsule  0  . ibuprofen (ADVIL,MOTRIN) 600 MG tablet Take 1 tablet (600 mg total) by mouth every 6 (six) hours as needed for pain.  30 tablet  0  . metroNIDAZOLE (FLAGYL) 500 MG tablet Take 1 tablet (500 mg total) by mouth 2 (two) times daily.  14 tablet  0  . oxyCODONE-acetaminophen (PERCOCET/ROXICET) 5-325 MG per tablet Take 1-2 tablets by mouth every 6 (six) hours as needed.  30 tablet  0  . penicillin v potassium (VEETID) 500 MG tablet Take 1 tablet (500 mg total) by mouth 4 (four) times daily.  40 tablet  0  . prenatal vitamin w/FE, FA (PRENATAL 1 + 1) 27-1 MG TABS tablet Take 1 tablet by  mouth daily.  30 each  6  . ranitidine (ZANTAC) 150 MG tablet Take 1 tablet (150 mg total) by mouth 2 (two) times daily.  60 tablet  0  . senna-docusate (SENOKOT-S) 8.6-50 MG per tablet Take 2 tablets by mouth daily as needed for constipation.  30 tablet  0  . traMADol (ULTRAM) 50 MG tablet Take 1 tablet (50 mg total) by mouth every 8 (eight) hours as needed for pain.  30 tablet  0   No current facility-administered medications for this visit.      ROS as above otherwise neg.  No chest pain, palpitations, SOB, Fever, Chills, Abd pain, N/V/D.   Objective:   Physical Exam BP 106/69  Pulse 71  Temp(Src) 99.9 F (37.7 C) (Oral)  Ht 5' 0.5" (1.537 m)  Wt 129 lb 12.8 oz (58.877 kg)  BMI 24.92 kg/m2 Gen:  Alert, cooperative patient who appears stated age in no acute distress.  Vital signs reviewed. HEENT: EOMI.  PERRL.  Facial swelling, asymmetric and worse on the Left.  Has edema and minimal erythema over Left check and dependent drainage to Left mandible. No periorbital erythema.  No conjunctivitis noted.  On oral examination, has fairly fair dentition except for broken upper left canine tooth.  Abscess s/p spontaneous drainage present with some  surrounding erythema, no further purulence noted.  Oropharynx pink and moist, no tonsillar edema noted.  Neck:  No LAD noted     No results found for this or any previous visit (from the past 72 hour(s)).

## 2013-03-08 NOTE — Assessment & Plan Note (Signed)
Possibly some overlying cellulitis versus simple edema from abscess. Abscess itself is improving since spontaneous drainage - but will likely recur.  Needs tooth extraction.  Plan to advance to Augmentin for anaerobic coverage above and beyond PCN.   Tramadol for severe pain, can continue Ibuprofen for moderate pain.  Dental referral urgently

## 2013-03-08 NOTE — Progress Notes (Signed)
Interpreter Adenike Shidler Namihira for [facility] 

## 2013-03-08 NOTE — Patient Instructions (Signed)
La medicina mas importante es el antiobiotico.  Colleen Thornton necesita ir a otra farmacia para la medicina del dolor.

## 2013-05-03 ENCOUNTER — Ambulatory Visit (INDEPENDENT_AMBULATORY_CARE_PROVIDER_SITE_OTHER): Payer: No Typology Code available for payment source | Admitting: *Deleted

## 2013-05-03 DIAGNOSIS — Z23 Encounter for immunization: Secondary | ICD-10-CM

## 2013-06-18 ENCOUNTER — Encounter: Payer: Self-pay | Admitting: Family Medicine

## 2013-06-18 ENCOUNTER — Ambulatory Visit (INDEPENDENT_AMBULATORY_CARE_PROVIDER_SITE_OTHER): Payer: No Typology Code available for payment source | Admitting: Family Medicine

## 2013-06-18 VITALS — BP 103/64 | HR 68 | Temp 97.8°F | Ht 65.0 in | Wt 128.0 lb

## 2013-06-18 DIAGNOSIS — N6459 Other signs and symptoms in breast: Secondary | ICD-10-CM

## 2013-06-18 DIAGNOSIS — R112 Nausea with vomiting, unspecified: Secondary | ICD-10-CM

## 2013-06-18 NOTE — Progress Notes (Signed)
   Subjective:    Patient ID: Colleen Thornton, female    DOB: 10/02/1985, 28 y.o.   MRN: [id number]  HPI  28 year old female who presents with complaints of nausea and vomiting.  Nausea, vomiting and diarrhea - The symptoms started 2 days ago. She also had diarrhea numerous times. However that resolved yesterday. She continues to have nausea but no vomiting today. Her by mouth intake has decreased, although she is still taking in liquids. She denies any fevers or chills. However she is fatigued. She has 2 daughters and her husband with similar symptoms preceding hers.  Left breast pain - started 2-3 days ago, is tender, mild swelling, no redness no nipple drainage, no fevers, patient is breast-feeding 8981-month-old  Review of Systems She denies lightheadedness, chest pain, shortness of breath, bowel pain    Objective:   Physical Exam  BP 103/64  Pulse 68  Temp(Src) 97.8 F (36.6 C) (Oral)  Ht 5\' 5"  (1.651 m)  Wt 128 lb (58.06 kg)  BMI 21.30 kg/m2 Gen. Mildly ill-appearing young female, nondistressed, pleasant and conversant Cardiovascular: RRR, no murmurs, 2+ peripheral pulses, capillary refill less than 2 seconds Lungs: clear to auscultation bilaterally, normal work of breathing Breasts: left lower breast with firm tenderness, no changes in skin, no nipple discharge, consistent with engorgement Abdomen: soft, nondistended, nontender, normoactive bowel sounds      Assessment & Plan:  1. Infectious gastroenteritis - patient with resolving diarrhea and vomiting but persistent nausea; no signs of dehydration the patient able to tolerate by mouth intake at home; use antinausea medication as needed  2. Engorgement of left breast - patient encouraged to use warm compresses several times a day and continue breast-feeding, no concerns for infection this time

## 2013-06-18 NOTE — Patient Instructions (Signed)
[place], Usted debe sentirse mejor para [place]. Usted puede tomar zofran en casa. Tomar 0,5 cucharadas. Para el dolor en los senos, por favor, utilice toallas calientes Hexion Specialty Chemicalssobre ellos un par de veces al da y continuar con la Tour managerlactancia materna.  Atentamente,  Dr. Clinton SawyerWilliamson

## 2013-07-09 ENCOUNTER — Ambulatory Visit (INDEPENDENT_AMBULATORY_CARE_PROVIDER_SITE_OTHER): Payer: No Typology Code available for payment source | Admitting: Family Medicine

## 2013-07-09 ENCOUNTER — Ambulatory Visit: Payer: No Typology Code available for payment source | Admitting: Family Medicine

## 2013-07-09 ENCOUNTER — Encounter: Payer: Self-pay | Admitting: Family Medicine

## 2013-07-09 VITALS — BP 137/82 | HR 61 | Temp 98.7°F | Ht 65.0 in | Wt 131.0 lb

## 2013-07-09 DIAGNOSIS — R209 Unspecified disturbances of skin sensation: Secondary | ICD-10-CM

## 2013-07-09 DIAGNOSIS — R203 Hyperesthesia: Secondary | ICD-10-CM | POA: Insufficient documentation

## 2013-07-09 DIAGNOSIS — Z309 Encounter for contraceptive management, unspecified: Secondary | ICD-10-CM

## 2013-07-09 NOTE — Progress Notes (Signed)
Patient ID: Colleen Thornton, female   DOB: 06/19/1985, 28 y.o.   MRN: [id number] Subjective:   CC: Breast pain and check IUD  HPI:   Breast pain - Patient reports that for 1 week she has had burning/hot breast pain on right nipple and just underneath nipple. It is slightly more painful when breastfeeding her daughter, and she has decreased from 6 feeds/day to 4. Came out of the blue, daughter does not bite her. She denies fevers, chills, erythema, swelling, bleeding, skin changes, or discharge. Pain is 3/10, when breastfeeding 5-6/10, worst was 10/10 initially for 3 days. It feels worse when it rubs against her clothing. Ibuprofen helps. Of note, she had engorgement of left breast ~3 weeks ago which improved with continued breastfeeding and warm compresses.   IUD check - Patient needs IUD string check. She states husband reports feeling strings during sex.  Review of Systems - Per HPI.   PMH: Breastfeeds  Smoking status: Nonsmoker    Objective:  Physical Exam BP 137/82  Pulse 61  Temp(Src) 98.7 F (37.1 C) (Oral)  Ht 5\' 5"  (1.651 m)  Wt 131 lb (59.421 kg)  BMI 21.80 kg/m2 GEN: NAD HEENT: Atraumatic, normocephalic, neck supple, EOMI, sclera clear  PULM: normal effort SKIN: No rash or cyanosis; warm and well-perfused; left breast with no discharge, erythema, swelling, or rash, mild tenderness just underneath nipple but no induration or swelling/firmness of breast. PSYCH: Mood and affect euthymic, normal rate and volume of speech NEURO: Awake, alert, no focal deficits grossly, normal speech GU: Vagina normal-appearing externally; vaginal mucosa normal-appearing; thin white discharge; cervix normal-appearing with strings curled around it at 4cm, snipped to 3cm    Assessment:     Colleen Thornton is a 28 y.o. female with h/o recent breast engorgement improved with feeds/warm compresses here for new breast pain and IUD string check.    Plan:     # See problem list and  after visit summary for problem-specific plans.  # Health Maintenance: Not discussed.  Follow-up: Follow up in 1-2 weeks for lack of symptom improvement or sooner if worsening.  Leona SingletonMaria T Nesbit Michon, MD [facility]

## 2013-07-09 NOTE — Assessment & Plan Note (Signed)
Burning skin with no physical exam findings - consider shingles though after 1 week would have expected rash to present. Skin does not appear cellulitic either, and no signs of abscess or engorgement. - Symptomatic treatment with lanolin cream/ointment and PRN tylenol. - Return precautions reviewed in 1-2 weeks if not improving or if worse. - Continue breastfeeding to avoid engorgement. - If persists, could call [facility] for suggestions. - Precepted with Dr Deirdre Priesthambliss.

## 2013-07-09 NOTE — Assessment & Plan Note (Signed)
Patient stated husband could feel strings. - Cut strings to 3cm. - Pt tolerated well.

## 2013-07-09 NOTE — Patient Instructions (Signed)
Puede tratar lanolin crema o ointment que puede comprar sin receta. Regresar en 1-2 semanas si no esta mejorando o esta empeorando. Continua dando de pecho y [place] bomba si tiene dolor dar de pecho.  He cortado su strings a 3cm.   Regresa si tiene algun problema.  Buenos.  Colleen SingletonMaria T Tomisha Reppucci, MD

## 2014-03-07 ENCOUNTER — Encounter: Payer: Self-pay | Admitting: Family Medicine

## 2015-08-11 ENCOUNTER — Encounter: Payer: Self-pay | Admitting: Family Medicine

## 2015-08-11 ENCOUNTER — Ambulatory Visit (INDEPENDENT_AMBULATORY_CARE_PROVIDER_SITE_OTHER): Payer: Self-pay | Admitting: Family Medicine

## 2015-08-11 VITALS — BP 129/86 | HR 64 | Temp 98.6°F | Wt 130.4 lb

## 2015-08-11 DIAGNOSIS — J302 Other seasonal allergic rhinitis: Secondary | ICD-10-CM

## 2015-08-11 DIAGNOSIS — H65193 Other acute nonsuppurative otitis media, bilateral: Secondary | ICD-10-CM

## 2015-08-11 MED ORDER — CETIRIZINE HCL 10 MG PO CAPS: 1.0000 | ORAL_CAPSULE | Freq: Every day | ORAL | Status: DC

## 2015-08-11 MED ORDER — AMOXICILLIN-POT CLAVULANATE 875-125 MG PO TABS: 1.0000 | ORAL_TABLET | Freq: Two times a day (BID) | ORAL | Status: DC

## 2015-08-11 NOTE — Patient Instructions (Addendum)
Thank you so much for coming to visit today! I have sent an antibiotic to the pharmacy for you. If you fail to improve over the next 1-2 weeks please return. I have also sent an allergy medication for you to take once daily.  Please return for a general check up with your Primary Physician.  Thanks again! Dr. Caroleen Hammanumley

## 2015-08-13 DIAGNOSIS — J302 Other seasonal allergic rhinitis: Secondary | ICD-10-CM | POA: Insufficient documentation

## 2015-08-13 NOTE — Progress Notes (Signed)
Subjective:     Patient ID: Colleen Thornton, female   DOB: 11/21/1985, 30 y.o.   MRN: [id number]  HPI Mrs. Tomasa HoseHerrera-Rodriguez is a 30yo female presenting with ear pain. Visit conducted with aid of Spanish Interpretor. - Has had left ear pain x4 weeks. - Diagnosed with ear infection two weeks ago by physician at [facility]. Prescribed course of Amoxicillin. - Reports completing course of Amoxicillin on Saturday or Sunday. States symptoms improved on antibiotics but did not resolved. - Right ear pain started yesterday. Reports pain is still worse in left ear.  - Reports feeling warm last night, but did not objectively measure temperature. - Also reports worsening allergies, noting symptoms of red itchy eyes, itchy throat, sneezing. Requests allergy medication. - Nonsmoker  Review of Systems Per HPI. Other systems negative.    Objective:   Physical Exam  Constitutional: She appears well-developed and well-nourished. No distress.  HENT:  Head: Normocephalic and atraumatic.  Mouth/Throat: Oropharynx is clear and moist.  Left tympanic membrane erythematous and bulging. Right tympanic membrane eyrthematous.  Eyes: Right eye exhibits no discharge. Left eye exhibits no discharge.  White sclera  Cardiovascular: Normal rate and regular rhythm.   Murmur heard. Pulmonary/Chest: Effort normal. No respiratory distress. She has no wheezes.  Lymphadenopathy:    She has no cervical adenopathy.  Psychiatric: She has a normal mood and affect. Her behavior is normal.      Assessment and Plan:     1. Other acute nonsuppurative otitis media of both ears - Bilateral, left worse than right - Completed course of Amoxicillin. Will prescribe course of Augmentin. - Follow up if no improvement in 1-2 weeks  2. Seasonal allergies - Prescription for Zyrtec given

## 2015-09-15 ENCOUNTER — Encounter: Payer: Self-pay | Admitting: Family Medicine

## 2015-09-15 ENCOUNTER — Ambulatory Visit (INDEPENDENT_AMBULATORY_CARE_PROVIDER_SITE_OTHER): Payer: Self-pay | Admitting: Family Medicine

## 2015-09-15 VITALS — BP 108/64 | HR 69 | Temp 98.2°F | Ht 65.0 in | Wt 132.5 lb

## 2015-09-15 DIAGNOSIS — M533 Sacrococcygeal disorders, not elsewhere classified: Secondary | ICD-10-CM

## 2015-09-15 MED ORDER — DICLOFENAC SODIUM 75 MG PO TBEC: 75.0000 mg | DELAYED_RELEASE_TABLET | Freq: Two times a day (BID) | ORAL | Status: DC
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
## 2015-09-15 NOTE — Patient Instructions (Addendum)
Thank you for coming to see me today. It was a pleasure. Today we talked about:   Coccyx pain: I will give you medication to help with your pain.  Please make an appointment to see Dr. Jimmey RalphParker if pain does not improve.  If you have any questions or concerns, please do not hesitate to call the office at [phone].  Sincerely,  Jacquelin Hawkingalph Leray Garverick, MD

## 2016-02-22 ENCOUNTER — Ambulatory Visit (INDEPENDENT_AMBULATORY_CARE_PROVIDER_SITE_OTHER): Payer: Self-pay | Admitting: Student

## 2016-02-22 ENCOUNTER — Encounter: Payer: Self-pay | Admitting: Student

## 2016-02-22 VITALS — BP 116/62 | HR 74 | Temp 98.3°F | Wt 134.0 lb

## 2016-02-22 DIAGNOSIS — J029 Acute pharyngitis, unspecified: Secondary | ICD-10-CM

## 2016-02-22 DIAGNOSIS — H6590 Unspecified nonsuppurative otitis media, unspecified ear: Secondary | ICD-10-CM | POA: Insufficient documentation

## 2016-02-22 DIAGNOSIS — H6592 Unspecified nonsuppurative otitis media, left ear: Secondary | ICD-10-CM

## 2016-02-22 LAB — POCT RAPID STREP A (OFFICE): RAPID STREP A SCREEN: NEGATIVE

## 2016-02-22 MED ORDER — AMOXICILLIN 500 MG PO CAPS: 500.0000 mg | ORAL_CAPSULE | Freq: Two times a day (BID) | ORAL | 0 refills | Status: DC

## 2016-02-22 NOTE — Assessment & Plan Note (Signed)
Negative strep test. Patient to use honey please see as needed for throat pain as well as throat lozenges. Will continue to follow.

## 2016-02-22 NOTE — Patient Instructions (Signed)
Follow up in 2 weeks as needed for ear infection Please take antibiotics as prescribed for ear infection If you have questions or concerns, call the office at [phone]

## 2016-02-22 NOTE — Progress Notes (Signed)
   Subjective:    Patient ID: Colleen Thornton, female    DOB: 11/23/1985, 30 y.o.   MRN: [id number]   CC: left ear pain and sore throat x 4 days  HPI: 30 y/o F with left ear pain and sore throat and left ear pain   Sore throat and left ear pain - Symptoms start 4 days ago - her daughter was sick with sore throat, cough and fever prior - she has not had fever or cough  - does report Rhinorrhea during this time - denies difficulty eating or drinking  - Denies decreases hearing from her left ear  Smoking status reviewed  Review of Systems  Per HPI, else denies recent illness, fever, headache, changes in vision, chest pain, shortness of breath, abdominal pain, N/V/D, weakness    Objective:  BP 116/62   Pulse 74   Temp 98.3 F (36.8 C) (Oral)   Wt 134 lb (60.8 kg)   BMI 22.30 kg/m  Vitals and nursing note reviewed  General: NAD HEENT: Left tympanic membrane erythematous, nonbulging, no effusion noted, normal right hepatic membrane Cardiac: RRR Respiratory: CTAB, normal effort Skin: warm and dry, no rashes noted Neuro: alert and oriented,   Assessment & Plan:    Non-suppurative otitis media Left otitis media, without effusion. Potentially viral versus bacterial nature. But will treat with antibiotics per patient request  - We'll treat with amoxicillin given on colchicine in nature  - Will follow   Sore throat Negative strep test. Patient to use honey please see as needed for throat pain as well as throat lozenges. Will continue to follow.    Mathias Bogacki A. Kennon RoundsHaney MD, MS Family Medicine Resident PGY-3 Pager [phone]

## 2016-02-22 NOTE — Assessment & Plan Note (Signed)
Left otitis media, without effusion. Potentially viral versus bacterial nature. But will treat with antibiotics per patient request  - We'll treat with amoxicillin given on colchicine in nature  - Will follow

## 2016-10-02 ENCOUNTER — Ambulatory Visit: Payer: Self-pay

## 2016-10-21 ENCOUNTER — Ambulatory Visit (INDEPENDENT_AMBULATORY_CARE_PROVIDER_SITE_OTHER): Payer: Self-pay | Admitting: Family Medicine

## 2016-10-21 ENCOUNTER — Encounter: Payer: Self-pay | Admitting: Family Medicine

## 2016-10-21 ENCOUNTER — Other Ambulatory Visit (HOSPITAL_COMMUNITY)
Admission: RE | Admit: 2016-10-21 | Discharge: 2016-10-21 | Disposition: A | Discharge status: Home / Self Care | Payer: Self-pay | Source: Ambulatory Visit | Attending: Family Medicine | Admitting: Family Medicine

## 2016-10-21 VITALS — BP 110/60 | HR 58 | Temp 98.0°F | Ht 65.0 in | Wt 138.4 lb

## 2016-10-21 DIAGNOSIS — Z0000 Encounter for general adult medical examination without abnormal findings: Secondary | ICD-10-CM

## 2016-10-21 DIAGNOSIS — T8332XA Displacement of intrauterine contraceptive device, initial encounter: Secondary | ICD-10-CM | POA: Insufficient documentation

## 2016-10-21 NOTE — Assessment & Plan Note (Signed)
IUD placed 11/2012. Strings not visualized today. Will check pelvic ultrasound and KUB if needed. Advised back up contraception until position of IUD is determined.

## 2016-10-21 NOTE — Progress Notes (Signed)
    Subjective:  Colleen Thornton is a 31 y.o. female who [facility] with a chief complaint of IUD check.   HPI:  IUD Check Had IUD 4 years ago. Has been tolerating this without any obvious side effects. She has not noticed if it has fallen out. No abdominal pain. No vaginal bleeding. She occasionally has foul smelling discharge. None currently. No fevers or chills.   ROS: Per HPI  Objective:  Physical Exam: BP 110/60 (BP Location: Left Arm, Patient Position: Sitting, Cuff Size: Normal)   Pulse (!) 58   Temp 98 F (36.7 C) (Oral)   Ht 5\' 5"  (1.651 m)   Wt 138 lb 6.4 oz (62.8 kg)   SpO2 98%   BMI 23.03 kg/m   Gen: NAD, resting comfortably CV: RRR with no murmurs appreciated Pulm: NWOB, CTAB with no crackles, wheezes, or rhonchi GI: Normal bowel sounds present. Soft, Nontender, Nondistended. GU: Normal external female genitalia. Cervix visualized without strings present. Scant amount of thick white discharge noted.  MSK: no edema, cyanosis, or clubbing noted Skin: warm, dry Neuro: grossly normal, moves all extremities Psych: Normal affect and thought content  Assessment/Plan:  Wellness examination Pap done today. Added on GC/CT, gardnerella, and yeast given that she is occasionally having foul smelling discharge.    Malpositioned intrauterine device IUD placed 11/2012. Strings not visualized today. Will check pelvic ultrasound and KUB if needed. Advised back up contraception until position of IUD is determined.    Katina Degreealeb M. Jimmey RalphParker, MD [facility] Resident PGY-3 10/21/2016 4:59 PM

## 2016-10-21 NOTE — Patient Instructions (Addendum)
We will get an ultrasound to check your IUD.  Your tests will come back in a few days.  Take care,  Dr Jimmey RalphParker

## 2016-10-21 NOTE — Assessment & Plan Note (Signed)
Pap done today. Added on GC/CT, gardnerella, and yeast given that she is occasionally having foul smelling discharge.

## 2016-10-24 LAB — CYTOLOGY - PAP
Bacterial vaginitis: NEGATIVE
CHLAMYDIA, DNA PROBE: NEGATIVE
Candida vaginitis: NEGATIVE
Diagnosis: NEGATIVE
HPV (WINDOPATH): NOT DETECTED
Neisseria Gonorrhea: NEGATIVE
TRICH (WINDOWPATH): NEGATIVE

## 2016-10-25 ENCOUNTER — Ambulatory Visit (HOSPITAL_COMMUNITY)
Admission: RE | Admit: 2016-10-25 | Discharge: 2016-10-25 | Disposition: A | Discharge status: Home / Self Care | Payer: Self-pay | Source: Ambulatory Visit | Attending: Family Medicine | Admitting: Family Medicine

## 2016-10-25 DIAGNOSIS — N83201 Unspecified ovarian cyst, right side: Secondary | ICD-10-CM | POA: Insufficient documentation

## 2016-10-25 DIAGNOSIS — T8332XA Displacement of intrauterine contraceptive device, initial encounter: Secondary | ICD-10-CM

## 2016-10-28 ENCOUNTER — Ambulatory Visit: Payer: Self-pay | Admitting: Family Medicine

## 2016-10-28 ENCOUNTER — Telehealth: Payer: Self-pay | Admitting: Family Medicine

## 2016-10-28 NOTE — Telephone Encounter (Signed)
Test results reviewed.  Pap smear normal.  IUD present on ultrasound.  Patient will likely need referral to GYN next year to have IUD removed and replaced.  Katina Degreealeb M. Jimmey RalphParker, MD [facility] Resident PGY-3 10/28/2016 1:40 PM

## 2016-10-30 NOTE — Telephone Encounter (Signed)
Pt contacted using pacific interpreter, Mikle BosworthCarlos [phone], pt was informed of normal pap smear and IUD placement.

## 2017-01-16 ENCOUNTER — Ambulatory Visit (INDEPENDENT_AMBULATORY_CARE_PROVIDER_SITE_OTHER): Payer: Self-pay | Admitting: Internal Medicine

## 2017-01-16 ENCOUNTER — Encounter: Payer: Self-pay | Admitting: Internal Medicine

## 2017-01-16 VITALS — BP 110/70 | HR 88 | Temp 98.2°F | Ht 65.0 in | Wt 137.0 lb

## 2017-01-16 DIAGNOSIS — J069 Acute upper respiratory infection, unspecified: Secondary | ICD-10-CM

## 2017-01-16 DIAGNOSIS — B9789 Other viral agents as the cause of diseases classified elsewhere: Secondary | ICD-10-CM

## 2017-01-16 MED ORDER — FLUTICASONE PROPIONATE 50 MCG/ACT NA SUSP: 2.0000 | Freq: Every day | NASAL | 1 refills | Status: DC

## 2017-01-16 NOTE — Progress Notes (Signed)
   Subjective:    Colleen Thornton - 31 y.o. female MRN [id number]  Date of birth: 09/02/1985  HPI  Colleen Thornton is here for sore throat.  SORE THROAT  Sore throat began 3 days ago. Pain is: scratchy and irritating to swallow foods.  Severity: Medium  Medications tried: Advil with some relief  Strep throat exposure: no. Daughter is sick with sore throat, congestion and cough but has not required abx treatment.  STD exposure: no  Symptoms Fever: no Cough: yes, dry and mild Runny nose: yes  Muscle aches: no Swollen Glands: no Trouble breathing: no Drooling: no Weight loss: no   Health Maintenance Due  Topic Date Due  . INFLUENZA VACCINE  12/04/2016    -  reports that she has never smoked. She has never used smokeless tobacco. - Review of Systems: Per HPI. - Past Medical History: Patient Active Problem List   Diagnosis Date Noted  . Malpositioned intrauterine device 10/21/2016  . Wellness examination 10/21/2016  . Seasonal allergies 08/13/2015  . Carpal tunnel syndrome 08/10/2012   - Medications: reviewed and updated   Objective:   Physical Exam BP 110/70   Pulse 88   Temp 98.2 F (36.8 C) (Oral)   Ht 5\' 5"  (1.651 m)   Wt 137 lb (62.1 kg)   SpO2 99%   BMI 22.80 kg/m  Gen: NAD, alert, cooperative with exam, well-appearing HEENT: NCAT, PERRL, clear conjunctiva, oropharynx erythematous but tonsils without exudates or hypertrophy, TMs normal bilaterally, turbinates edematous bilaterally, supple neck without LAD  CV: RRR, good S1/S2, no murmur, no edema, capillary refill brisk  Resp: CTABL, no wheezes, non-labored  Assessment & Plan:   1. Viral URI with cough Suspect sore throat related to postnasal drip from viral URI given other symptoms present. Centor criteria 0 so low suspicion for strep throat; elected not to swab. Lungs clear, patient afebrile and without hypoxia so doubt bronchitis or CAP. Will treat with Flonase. Recommended OTC  generic Sudafed. Discussed supportive care measures such as hot beverages, OTC analgesics, throat lozenges, chloraseptic throat numbing spray. Return precautions discussed.  - fluticasone (FLONASE) 50 MCG/ACT nasal spray; Place 2 sprays into both nostrils daily.  Dispense: 16 g; Refill: 1  Marcy Sirenatherine Wallace, D.O. 01/16/2017, 1:48 PM PGY-3, [facility]

## 2017-01-16 NOTE — Patient Instructions (Signed)
I believe you have a viral URI. I have sent in Flonase, a nasal spray for congestion. I would also recommend the generic Sudafed (psuedoephedrine) for congestion.    You can use Chloraseptic throat numbing spray or throat lozenges. Hot beverages can also be soothing. You can continue Advil, Ibuprofen, Tylenol etc for pain relief.   Please return if your symptoms continue to get worse, you develop fevers or you have trouble breathing.

## 2017-03-31 NOTE — Progress Notes (Signed)
   Subjective:   Patient ID: Colleen Thornton    DOB: 09/01/1985, 31 y.o. female   MRN: [id number]  Colleen Thornton is a 31 y.o. female with a history of gestational diabetes here for   H/o Gestational Diabetes - Has h/o gestational diabetes about 10 years ago, wants to know if she has diabetes - did not require meds or insulin during pregnancy, checked sugars every day - does not check sugars at home now - FH of diabetes - father's side several members - Diet: 10am oats/breakfast, 3pm eats lunch. Varies depending on work schedule. - Exercise: cardio at the gym, some weight lifting. Varies with work schedule, 3x/wk. - BMI consistently 22-23 the last 6 months. - Denies polyuria, polydipsia. Although sometimes in the morning feels like going to bathroom more often in the morning, is not consistent. No burning when she pees. - no medications at home. One month ago was taking PCN for tooth extraction prophylaxis.  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   PMFSH: reviewed. Smoking status reviewed. Medications reviewed.  Objective:   BP 100/62   Pulse (!) 52   Temp 98.4 F (36.9 C) (Oral)   Ht 5\' 5"  (1.651 m)   Wt 136 lb (61.7 kg)   LMP 03/18/2017   SpO2 98%   BMI 22.63 kg/m  Vitals and nursing note reviewed.  General: well nourished, well developed, in no acute distress with non-toxic appearance CV: regular rhythm, hemodynamically stable Lungs: normal work of breathing, sating well on room air Skin: warm, dry, no rashes or lesions Extremities: warm and well perfused, normal tone MSK: ROM grossly intact, gait normal Neuro: Alert and oriented, speech normal  Assessment & Plan:   History of gestational diabetes H/o gestational diabetes noted 10 years ago but no need for medication at that time. A1c today 4.8. BMI within normal range with good  diet and exercise regimens. Encouraged to continue healthy lifestyle as this is the best way to prevent diabetes. Can recheck A1c in a year or two, or sooner if she develops symptoms of hyperglycemia.  Orders Placed This Encounter  Procedures  . POCT glycosylated hemoglobin (Hb A1C)   No orders of the defined types were placed in this encounter.   Ellwood DenseAlison Rumball, DO PGY-1, [facility] 04/01/2017 4:59 PM

## 2017-04-01 ENCOUNTER — Ambulatory Visit (INDEPENDENT_AMBULATORY_CARE_PROVIDER_SITE_OTHER): Payer: Self-pay | Admitting: Family Medicine

## 2017-04-01 ENCOUNTER — Encounter: Payer: Self-pay | Admitting: Family Medicine

## 2017-04-01 ENCOUNTER — Other Ambulatory Visit: Payer: Self-pay

## 2017-04-01 VITALS — BP 100/62 | HR 52 | Temp 98.4°F | Ht 65.0 in | Wt 136.0 lb

## 2017-04-01 DIAGNOSIS — Z8632 Personal history of gestational diabetes: Secondary | ICD-10-CM

## 2017-04-01 LAB — POCT GLYCOSYLATED HEMOGLOBIN (HGB A1C): HEMOGLOBIN A1C: 4.8

## 2017-04-01 NOTE — Assessment & Plan Note (Signed)
H/o gestational diabetes noted 10 years ago but no need for medication at that time. A1c today 4.8. BMI within normal range with good diet and exercise regimens. Encouraged to continue healthy lifestyle as this is the best way to prevent diabetes. Can recheck A1c in a year or two, or sooner if she develops symptoms of hyperglycemia.

## 2017-04-01 NOTE — Patient Instructions (Signed)
It was great to see you!  Your blood sugars are great! You do not have diabetes.  - Continue with a varied diet including lots of fruits and vegetables. - Continue having an active lifestyle including cardio and weight training at the gym.  We can recheck another sugar level in a year or two or sooner if you develop symptoms such as increased thirst or frequent urination.  Take care and seek immediate care sooner if you develop any concerns.   Ellwood DenseAlison Lamae Fosco, DO [facility]

## 2017-12-19 ENCOUNTER — Other Ambulatory Visit: Payer: Self-pay

## 2017-12-19 ENCOUNTER — Ambulatory Visit (INDEPENDENT_AMBULATORY_CARE_PROVIDER_SITE_OTHER): Payer: Self-pay | Admitting: Family Medicine

## 2017-12-19 ENCOUNTER — Encounter: Payer: Self-pay | Admitting: Family Medicine

## 2017-12-19 VITALS — BP 124/62 | HR 52 | Temp 99.1°F | Ht 65.0 in | Wt 133.6 lb

## 2017-12-19 DIAGNOSIS — Z30432 Encounter for removal of intrauterine contraceptive device: Secondary | ICD-10-CM

## 2017-12-19 DIAGNOSIS — Z309 Encounter for contraceptive management, unspecified: Secondary | ICD-10-CM

## 2017-12-19 DIAGNOSIS — T8332XD Displacement of intrauterine contraceptive device, subsequent encounter: Secondary | ICD-10-CM

## 2017-12-19 MED ORDER — NORETHINDRONE 0.35 MG PO TABS: 1.0000 | ORAL_TABLET | Freq: Every day | ORAL | 11 refills | Status: DC

## 2017-12-19 MED ORDER — IBUPROFEN 200 MG PO TABS
600.0000 mg | ORAL_TABLET | Freq: Once | ORAL | Status: AC
  Administered 2017-12-19: 600 mg via ORAL

## 2017-12-19 NOTE — Progress Notes (Signed)
    Subjective:  Colleen Thornton is a 32 y.o. female [facility].  HPI: Patient had Mirena IUD placed on 11/13/2012.  Patient wants to have it removed.  Patient denies any vaginal bleeding or discharge or discomfort from this.  Patient would like an IUD to replace the one she currently has.  The patient was seen on 10/2016 for IUD removal.  At that time no strings were visualized and therefore was not removed.  Patient had ultrasound to confirm placement on 10/25/2016 which noted IUD still localized within the uterus.  Patient has returned today to have IUD it removed.  Patient was consented for IUD removal.  Removal was attempted, however no strings were visualized.   Objective:  Physical Exam: BP 124/62   Pulse (!) 52   Temp 99.1 F (37.3 C) (Oral)   Ht 5\' 5"  (1.651 m)   Wt 133 lb 9.6 oz (60.6 kg)   SpO2 93%   BMI 22.23 kg/m   Gen: NAD, resting comfortably GU: Cervix visualized, thin mucus, no cervical motion tenderness, no lesions, no IUD strings visualized No results found for this or any previous visit (from the past 72 hour(s)).   Assessment/Plan:  Malpositioned intrauterine device Given attempt to remove IUD unsuccessful x2.  Will refer to OB/GYN for further assessment.   Lab Orders  No laboratory test(s) ordered today    Meds ordered this encounter  Medications  . DISCONTD: norethindrone (MICRONOR,CAMILA,ERRIN) 0.35 MG tablet    Sig: Take 1 tablet (0.35 mg total) by mouth daily.    Dispense:  1 Package    Refill:  11  . ibuprofen (ADVIL,MOTRIN) tablet 600 mg    Thomes DinningBrad Alainah Phang, MD, MS FAMILY MEDICINE RESIDENT - PGY2 12/19/2017 5:37 PM

## 2017-12-19 NOTE — Assessment & Plan Note (Signed)
Given attempt to remove IUD unsuccessful x2.  Will refer to OB/GYN for further assessment.

## 2017-12-19 NOTE — Patient Instructions (Signed)
It was a pleasure to see you today! Thank you for choosing [facility] for your primary care. Colleen BlamerBlanca Thornton was seen for IUD removal, but we were unable to get it. We are referring you to OB/GYN for further evaluation.   Best,  Thomes DinningBrad Thompson, MD, MS FAMILY MEDICINE RESIDENT - PGY2 12/19/2017 4:23 PM

## 2017-12-30 ENCOUNTER — Encounter: Payer: Self-pay | Admitting: Family Medicine

## 2018-02-16 ENCOUNTER — Encounter: Payer: Self-pay | Admitting: Obstetrics & Gynecology

## 2018-02-25 ENCOUNTER — Ambulatory Visit: Payer: Self-pay | Admitting: Obstetrics and Gynecology

## 2018-02-25 ENCOUNTER — Encounter: Payer: Self-pay | Admitting: Obstetrics and Gynecology

## 2018-02-25 VITALS — BP 125/71 | HR 59 | Ht 60.0 in | Wt 136.3 lb

## 2018-02-25 DIAGNOSIS — Z30432 Encounter for removal of intrauterine contraceptive device: Secondary | ICD-10-CM

## 2018-02-25 NOTE — Progress Notes (Signed)
   IUD Removal Procedure Note   Patient is 32 y.o. [id number] who is here for [facility] IUD removal. She would like IUD removed secondary to expiration, plans to get another at [facility]. Was referred here as they were unable to remove IUD at [facility]. She has had no issues with IUD. She understands that she could get pregnant after removal of IUD if she does not use another form of contraception. She has no other complaints today. Reviewed risks of removal including pain, bleeding, difficult removal and inability to remove IUD which may require hysteroscopic removal in OR. She affirms that she would like IUD removed.  BP 125/71   Pulse (!) 59   Ht 5' (1.524 m)   Wt 136 lb 4.8 oz (61.8 kg)   BMI 26.62 kg/m   Patient with normal appearing external female genitalia. Graves speculum placed in vagina and no IUD strings visualized. The uterus was sounded and the cervix was cleaned with betadine x2. The IUD hook was introduced and no strings were able to be grasped. Dr. Shawnie Pons called for assistance, who was able to hook strings and they were then visible from os. Strings grasped with ring forceps and removed easily. Minimal bleeding noted. All instruments removed from vagina. Patient tolerated procedure very well.    She was given post removal instructions. She is planning on using Mirena for contraception to be placed at [facility].  Return 1 year for annual or prn.    Baldemar Lenis, M.D. [facility] [employer]

## 2018-03-02 ENCOUNTER — Encounter: Payer: Self-pay | Admitting: *Deleted

## 2018-03-30 ENCOUNTER — Other Ambulatory Visit: Payer: Self-pay

## 2018-03-30 ENCOUNTER — Ambulatory Visit (INDEPENDENT_AMBULATORY_CARE_PROVIDER_SITE_OTHER): Payer: Self-pay | Admitting: Family Medicine

## 2018-03-30 ENCOUNTER — Encounter: Payer: Self-pay | Admitting: Family Medicine

## 2018-03-30 VITALS — BP 95/55 | HR 58 | Temp 98.3°F | Wt 135.0 lb

## 2018-03-30 DIAGNOSIS — Z30011 Encounter for initial prescription of contraceptive pills: Secondary | ICD-10-CM | POA: Insufficient documentation

## 2018-03-30 DIAGNOSIS — Z3202 Encounter for pregnancy test, result negative: Secondary | ICD-10-CM

## 2018-03-30 LAB — POCT URINE PREGNANCY: Preg Test, Ur: NEGATIVE

## 2018-03-30 MED ORDER — NORGESTIM-ETH ESTRAD TRIPHASIC 0.18/0.215/0.25 MG-25 MCG PO TABS: 1.0000 | ORAL_TABLET | Freq: Every day | ORAL | 11 refills | Status: DC

## 2018-03-30 NOTE — Patient Instructions (Signed)
Informacin sobre los Civil Service fast streameranticonceptivos orales (Oral Contraception Information) Los anticonceptivos orales (ACO) son medicamentos que se utilizan para Location managerevitar el embarazo. Su funcin es ALLTEL Corporationevitar que los ovarios liberen vulos. Las hormonas de los ACO tambin hacen que el moco cervical se haga ms espeso, lo que evita que el esperma ingrese al tero. Tambin hacen que la membrana que recubre internamente al tero se vuelva ms fina, lo que no permite que el huevo fertilizado se adhiera a la pared del tero. Los ACO son muy efectivos cuando se toman exactamente como se prescriben. Sin embargo, no previenen contra las enfermedades de transmisin sexual (ETS). La prctica del sexo seguro, como el uso de preservativos, junto con la Tutuillapldora, Egyptayudan a prevenir ese tipo de enfermedades. Antes de tomar la pldora, usted debe hacerse un examen fsico y un test de Pap. El mdico podr indicarle anlisis de [place], si es necesario. El mdico se asegurar de que usted sea [place] buena candidata para usar anticonceptivos orales. Converse con su mdico acerca de los posibles efectos secundarios de los ACO que podran recetarle. Cuando se inicia el uso de ACO, puede llevar 2 a 3 meses para que su organismo se adapte a los cambios en los niveles hormonales. TIPOS DE ANTICONCEPTIVOS ORALES  Pldora combinada: esta pldora contiene las hormonas estrgeno y progestina (progesterona sinttica). La pldora combinada viene en envases para [address] das, [address]28 das o [address] das. Algunos tipos de pldoras combinadas deben tomarse de manera continua (pldoras para 365 das). En los envases para [address]21 das, usted no tomar las pldoras durante 7 [address]  [address] despus de la ltima pldora. En los envases para [address]28 das, la pldora se toma CarMaxtodos los das. Las ltimas 7 no contienen hormonas. Ciertos tipos de pldoras tienen ms de 21 pldoras que contienen hormonas. En los envases para [address]91 das, las primeras 84 pldoras contienen ambas hormonas y las ltimas 7 pldoras no  contienen hormonas o contienen slo Cabin crewestrgenos.  La minipldora: esta pldora contiene la hormona progesterona solamente. Es necesario tomarla todos los das de Cookmanera continua. Es importante que las tome a la misma hora todos Inyokernlos das. Viene en envases de 28 pldoras. Las 28 pldoras contienen la hormona. VENTAJAS DE LOS ANTICONCEPTIVOS ORALES  Disminuye los sntomas premenstruales.  Se [place]a para tratar los Best Buyclicos menstruales.  Regula el ciclo menstrual.  Disminuye el ciclo menstrual abundante.  Puede mejorar el acn, segn el tipo de pldora.  Trata hemorragias uterinas anormales.  Trata el sndrome ovrico poliqustico.  Trata la endometriosis.  Pueden usarse como anticonceptivo de Associate Professoremergencia. FACTORES QUE PUEDEN HACER QUE LOS ANTICONCEPTIVOS ORALES SEAN MENOS EFECTIVOS Pueden ser menos efectivos si:  Olvid tomar la J. C. Penneypldora todos los das a la misma hora.  Tiene una enfermedad estomacal o intestinal que disminuye la absorcin de la pldora.  Ingiere simultneamente los anticonceptivos orales junto con otros medicamentos que los hacen menos efectivos, como antibiticos, ciertos medicamentos para el VIH y algunos medicamentos para las convulsiones.  Usted toma anticonceptivos orales que han vencido.  Cuando se [place]a el envase de [place] das, se olvida de recomenzar el uso American Expressen el da 7. RIESGOS ASOCIADOS AL USO DE ANTICONCEPTIVOS ORALES Los anticonceptivos orales pueden en algunos casos causar efectos secundarios como:  Dolor de Turkmenistancabeza.  Nuseas.  Inflamacin mamaria.  Hemorragia vaginal o manchado irregular. Las pldoras combinadas tambin se asocian a un pequeo aumento en el riesgo de:  Cogulos sanguneos.  Ataque cardaco.  Ictus. Esta informacin no tiene Theme park managercomo fin reemplazar el consejo del mdico. Asegrese de  se asocian a un pequeño aumento en el riesgo de:  · Coágulos sanguíneos.  · Ataque cardíaco.  · Ictus.  Esta información no tiene como fin reemplazar el consejo del médico. Asegúrese de hacerle al médico cualquier pregunta que tenga.  Document Released: 01/30/2005 Document Revised: 08/14/2015 Document Reviewed: 10/11/2012   Elsevier Interactive Patient Education © 2018 Elsevier Inc.

## 2018-03-30 NOTE — Assessment & Plan Note (Addendum)
Urine pregnancy was neg.   -Mirena scholarship form provided. -Use of OCPs until scholarship processed -Return in 6 weeks for IUD insertion

## 2018-03-30 NOTE — Progress Notes (Signed)
New Patient Office Visit  Subjective:  Patient ID: Colleen Thornton, female    DOB: 01-30-1986  Age: 32 y.o. MRN: [id number]  CC:  Chief Complaint  Patient presents with  . Contraception    HPI Due to language barrier, an interpreter was present during the history-taking and subsequent discussion (and for part of the physical exam) with this patient.  Pt here to discuss birth control. Pt previously IUD, recently removed. Pt does not have health insurance.  Patient is wanting Mirena replacement.  Patient provided with scholarship.    Colleen Thornton presents for insertion on IUD  Past Medical History:  Diagnosis Date  . Gestational diabetes    G2 only - diet controlled  . History of gestational diabetes 04/01/2017    No past surgical history on file.  Family History  Problem Relation Age of Onset  . Diabetes Father   . Hyperlipidemia Sister   . Diabetes Paternal Grandmother     Social History   Socioeconomic History  . Marital status: Significant Other    Spouse name: Not on file  . Number of children: Not on file  . Years of education: Not on file  . Highest education level: Not on file  Occupational History  . Not on file  Social Needs  . Financial resource strain: Not on file  . Food insecurity:    Worry: Not on file    Inability: Not on file  . Transportation needs:    Medical: Not on file    Non-medical: Not on file  Tobacco Use  . Smoking status: Never Smoker  . Smokeless tobacco: Never Used  Substance and Sexual Activity  . Alcohol use: No  . Drug use: No  . Sexual activity: Yes    Birth control/protection: IUD  Lifestyle  . Physical activity:    Days per week: Not on file    Minutes per session: Not on file  . Stress: Not on file  Relationships  . Social connections:    Talks on phone: Not on file    Gets together: Not on file    Attends religious service: Not on file    Active member of club or organization: Not on file      Attends meetings of clubs or organizations: Not on file    Relationship status: Not on file  . Intimate partner violence:    Fear of current or ex partner: Not on file    Emotionally abused: Not on file    Physically abused: Not on file    Forced sexual activity: Not on file  Other Topics Concern  . Not on file  Social History Narrative  . Not on file    ROS Review of Systems  All other systems reviewed and are negative.   Objective:   Today's Vitals: BP (!) 95/55   Pulse (!) 58   Temp 98.3 F (36.8 C) (Oral)   Wt 135 lb (61.2 kg)   LMP 03/25/2018   SpO2 98%   BMI 26.37 kg/m   Physical Exam  Constitutional: She is oriented to person, place, and time. She appears well-developed. No distress.  HENT:  Head: Normocephalic and atraumatic.  Eyes: Conjunctivae are normal.  Neck: No JVD present. No tracheal deviation present.  Cardiovascular: Regular rhythm.  Pulmonary/Chest: Breath sounds normal.  Musculoskeletal: She exhibits no edema.  Neurological: She is alert and oriented to person, place, and time.  Skin: Skin is dry.  Psychiatric: She has a normal mood and  affect. Her behavior is normal.    Assessment & Plan:   Problem List Items Addressed This Visit      Other   Encounter for initial prescription of contraceptive pills - Primary    Urine pregnancy was neg.   -Mirena scholarship form provided. -Use of OCPs until scholarship processed -Return in 6 weeks for IUD insertion      Relevant Medications   Norgestimate-Ethinyl Estradiol Triphasic 0.18/0.215/0.25 MG-25 MCG tab   Other Relevant Orders   POCT urine pregnancy (Completed)      Outpatient Encounter Medications as of 03/30/2018  Medication Sig  . Norgestimate-Ethinyl Estradiol Triphasic 0.18/0.215/0.25 MG-25 MCG tab Take 1 tablet by mouth daily.   No facility-administered encounter medications on file as of 03/30/2018.     Follow-up: Return in about 6 weeks (around 05/11/2018) for IUD  insertion.   Garnette GunnerAaron B Thomasa Heidler, MD

## 2019-05-14 ENCOUNTER — Ambulatory Visit: Payer: Self-pay

## 2019-05-14 ENCOUNTER — Telehealth: Payer: Self-pay

## 2019-05-14 NOTE — Telephone Encounter (Signed)
Called patient using [employer] ID# [phone].  There was no answer.  LVM informing patient that [facility] would be closing because of inclement weather on the afternoon of 05/14/2019.    Patient should call office to reschedule at another time.  Glennie Hawk, CMA'

## 2019-05-17 ENCOUNTER — Other Ambulatory Visit (HOSPITAL_COMMUNITY)
Admission: RE | Admit: 2019-05-17 | Discharge: 2019-05-17 | Disposition: A | Discharge status: Home / Self Care | Payer: Self-pay | Source: Ambulatory Visit | Attending: Family Medicine | Admitting: Family Medicine

## 2019-05-17 ENCOUNTER — Ambulatory Visit (INDEPENDENT_AMBULATORY_CARE_PROVIDER_SITE_OTHER): Payer: Self-pay | Admitting: Family Medicine

## 2019-05-17 ENCOUNTER — Other Ambulatory Visit: Payer: Self-pay

## 2019-05-17 VITALS — BP 100/70 | HR 81 | Temp 98.8°F | Wt 136.0 lb

## 2019-05-17 DIAGNOSIS — N949 Unspecified condition associated with female genital organs and menstrual cycle: Secondary | ICD-10-CM | POA: Insufficient documentation

## 2019-05-17 DIAGNOSIS — Z7251 High risk heterosexual behavior: Secondary | ICD-10-CM

## 2019-05-17 DIAGNOSIS — B373 Candidiasis of vulva and vagina: Secondary | ICD-10-CM

## 2019-05-17 DIAGNOSIS — B3731 Acute candidiasis of vulva and vagina: Secondary | ICD-10-CM | POA: Insufficient documentation

## 2019-05-17 LAB — POCT WET PREP (WET MOUNT)
Clue Cells Wet Prep Whiff POC: NEGATIVE
Trichomonas Wet Prep HPF POC: ABSENT

## 2019-05-17 LAB — POCT URINE PREGNANCY: Preg Test, Ur: NEGATIVE

## 2019-05-17 MED ORDER — FLUCONAZOLE 150 MG PO TABS: 150.0000 mg | ORAL_TABLET | Freq: Once | ORAL | 0 refills | Status: AC

## 2019-05-17 NOTE — Assessment & Plan Note (Signed)
Wet prep consistent with candidiasis.  Given patient's symptoms of discomfort, will treat for yeast infection. -Diflucan 150 mg x 1, repeat in 3 days if no improvement

## 2019-05-17 NOTE — Patient Instructions (Addendum)
Thank you for coming to see me today. It was a pleasure. Today we talked about:   You do have a yeast infection, which could be contributing to your symptoms.  I have sent a prescription to the pharmacy for Diflucan.  Take 1 tablet, then in 3 days if you have not had resolution of your symptoms, take another.  Use cotton underwear.  If you are comfortable, let your vaginal area breathe at night and do not use underwear.  Avoid shaving too frequently.  Make sure that your razor is very sharp if you do want to shave.  Clean immediately after exercising.  To make sure that your vaginal area is dry after shower, you can use a hair dryer on the cool setting.  The rest of your test will come back in the next few days.  We will contact you with the results.  If you have any questions or concerns, please do not hesitate to call the office at [phone].  Best,   Luis Abed, DO

## 2019-05-17 NOTE — Progress Notes (Signed)
Subjective: Chief Complaint  Patient presents with  . vaginal irritation     HPI: Colleen Thornton is a 34 y.o. presenting to clinic today to discuss the following:  Her daughter is in the room and acts as an interpreter at the patient's request.  1 Vaginal Irritation Has been occurring off and on since September.  Reports that she has not been shaving for the last few days to try to improve this, but also states that shaving does not make the irritation worse.  Notices it more after she works out.  Is sexually active with one partner.  Is not concerned for STDs, but would like gonorrhea/chlamydia testing.  LMP 12/16.  Not currently on any form of contraception.  Thinks she is about to start her period now.  Has not had any problems with urination, no changes in vaginal discharge.  Only feels a slight irritation that comes and goes.  No fevers, nausea, vomiting.  No abdominal pain.  Health Maintenance: UTD Pap, negative/negative on 10/2016     ROS noted in HPI. Chief complaint noted.  Other Pertinent PMH: G3P3 Past Medical, Surgical, Social, and Family History Reviewed & Updated per EMR.      Social History   Tobacco Use  Smoking Status Never Smoker  Smokeless Tobacco Never Used   Smoking status noted.    Objective: BP 100/70   Pulse 81   Temp 98.8 F (37.1 C) (Oral)   Wt 136 lb (61.7 kg)   LMP 04/21/2019   SpO2 99%   BMI 26.56 kg/m  Vitals and nursing notes reviewed  Physical Exam:  General: 34 y.o. female in NAD Lungs: Breathing comfortably on room air Skin: warm and dry GU: Pelvic exam performed with patient supine.  Chaperone in room.  Bilateral labia without abnormalities, no inguinal LAD palpated.  Cervix exhibits small amount of bloody discharge, ectropion noted.  No vaginal lesions, short pubic hair without obvious skin deformity on bilateral labia.  Vaginal discharge white/yellow and thin.   Results for orders placed or performed in visit on  05/17/19 (from the past 72 hour(s))  POCT Wet Prep [facility] [place])     Status: Abnormal   Collection Time: 05/17/19  4:25 PM  Result Value Ref Range   Source Wet Prep POC VAG    WBC, Wet Prep HPF POC 1-5    Bacteria Wet Prep HPF POC Moderate (A) Few   Clue Cells Wet Prep HPF POC None None   Clue Cells Wet Prep Whiff POC Negative Whiff    Yeast Wet Prep HPF POC Few (A) None   KOH Wet Prep POC Few (A) None   Trichomonas Wet Prep HPF POC Absent Absent  POCT urine pregnancy     Status: None   Collection Time: 05/17/19  4:33 PM  Result Value Ref Range   Preg Test, Ur Negative Negative    Assessment/Plan:  Vaginal discomfort No obvious source of discomfort on external exam.  Internal exam without concern.  Did perform POC pregnancy test, was negative.  Wet prep showed yeast, which could contribute.  Discussed with patient that irritation could be secondary to shaving and working out.  Advised to use cotton underwear and let the area breathe, can go without underwear at night if comfortable doing so.  Avoid frequent shaving or shaving with razor that is not sharp enough that this can cause discomfort as well.  Vaginal candidiasis Wet prep consistent with candidiasis.  Given patient's symptoms of discomfort, will  treat for yeast infection. -Diflucan 150 mg x 1, repeat in 3 days if no improvement     PATIENT EDUCATION PROVIDED: See AVS    Diagnosis and plan along with any newly prescribed medication(s) were discussed in detail with this patient today. The patient verbalized understanding and agreed with the plan. Patient advised if symptoms worsen return to clinic or ER.     Orders Placed This Encounter  Procedures  . POCT Wet Prep [employer])  . POCT urine pregnancy    Meds ordered this encounter  Medications  . fluconazole (DIFLUCAN) 150 MG tablet    Sig: Take 1 tablet (150 mg total) by mouth once for 1 dose. Repeat in 3 days if no improvement.    Dispense:  1 tablet    Refill:  0      Luis Abed, DO 05/17/2019, 4:41 PM PGY-2 [facility]

## 2019-05-17 NOTE — Assessment & Plan Note (Addendum)
No obvious source of discomfort on external exam.  Internal exam without concern.  Did perform POC pregnancy test, was negative.  Wet prep showed yeast, which could contribute.  Discussed with patient that irritation could be secondary to shaving and working out.  Advised to use cotton underwear and let the area breathe, can go without underwear at night if comfortable doing so.  Avoid frequent shaving or shaving with razor that is not sharp enough that this can cause discomfort as well.

## 2019-05-19 ENCOUNTER — Telehealth: Payer: Self-pay | Admitting: *Deleted

## 2019-05-19 LAB — CERVICOVAGINAL ANCILLARY ONLY
Chlamydia: NEGATIVE
Comment: NEGATIVE
Comment: NORMAL
Neisseria Gonorrhea: NEGATIVE

## 2019-05-19 NOTE — Telephone Encounter (Signed)
-----   Message from Unknown Jim, DO sent at 05/19/2019  2:11 PM EST ----- Please call patient and advise that her gonorrhea and Chlamydia tests are negative.

## 2019-05-19 NOTE — Telephone Encounter (Signed)
Called number listed to give results and person that answered phone said pt used to live with her and that they used her number for pts contact. Instructed lady to have pt call us so we can give her the below information and also to give [place] a contact number when she calls. Lajuan Godbee Zimmerman Rumple, CMA

## 2019-05-20 NOTE — Telephone Encounter (Signed)
Pt returned call and patient advised that tests for gonorrhea and chlamydia were both negative.   Veronda Prude, RN

## 2019-06-07 ENCOUNTER — Ambulatory Visit: Payer: Self-pay | Admitting: Family Medicine

## 2019-06-07 NOTE — Progress Notes (Unsigned)
   CHIEF COMPLAINT / HPI:  ***  PERTINENT  PMH / PSH: ***   OBJECTIVE: There were no vitals taken for this visit.  ***  ASSESSMENT / PLAN:  No problem-specific Assessment & Plan notes found for this encounter.     Kodi Guerrera B Araceli Coufal, MD [facility] 

## 2019-07-12 ENCOUNTER — Other Ambulatory Visit: Payer: Self-pay

## 2019-07-12 ENCOUNTER — Encounter: Payer: Self-pay | Admitting: Family Medicine

## 2019-07-12 ENCOUNTER — Ambulatory Visit (INDEPENDENT_AMBULATORY_CARE_PROVIDER_SITE_OTHER): Payer: Self-pay | Admitting: Family Medicine

## 2019-07-12 ENCOUNTER — Other Ambulatory Visit (HOSPITAL_COMMUNITY)
Admission: RE | Admit: 2019-07-12 | Discharge: 2019-07-12 | Disposition: A | Discharge status: Home / Self Care | Payer: Self-pay | Source: Ambulatory Visit | Attending: Family Medicine | Admitting: Family Medicine

## 2019-07-12 VITALS — BP 118/64 | HR 61 | Wt 133.4 lb

## 2019-07-12 DIAGNOSIS — R300 Dysuria: Secondary | ICD-10-CM

## 2019-07-12 DIAGNOSIS — N898 Other specified noninflammatory disorders of vagina: Secondary | ICD-10-CM | POA: Insufficient documentation

## 2019-07-12 DIAGNOSIS — J302 Other seasonal allergic rhinitis: Secondary | ICD-10-CM

## 2019-07-12 DIAGNOSIS — N949 Unspecified condition associated with female genital organs and menstrual cycle: Secondary | ICD-10-CM

## 2019-07-12 LAB — POCT URINE PREGNANCY: Preg Test, Ur: NEGATIVE

## 2019-07-12 LAB — POCT URINALYSIS DIP (MANUAL ENTRY)
Bilirubin, UA: NEGATIVE
Glucose, UA: NEGATIVE mg/dL
Ketones, POC UA: NEGATIVE mg/dL
Leukocytes, UA: NEGATIVE
Nitrite, UA: NEGATIVE
Protein Ur, POC: NEGATIVE mg/dL
Spec Grav, UA: 1.025 (ref 1.010–1.025)
Urobilinogen, UA: 0.2 E.U./dL
pH, UA: 5.5 (ref 5.0–8.0)

## 2019-07-12 LAB — POCT WET PREP (WET MOUNT)
Clue Cells Wet Prep Whiff POC: NEGATIVE
Trichomonas Wet Prep HPF POC: ABSENT

## 2019-07-12 MED ORDER — LEVOCETIRIZINE DIHYDROCHLORIDE 5 MG PO TABS: 5.0000 mg | ORAL_TABLET | Freq: Every evening | ORAL | 1 refills | Status: DC

## 2019-07-12 MED ORDER — FLUTICASONE PROPIONATE 50 MCG/ACT NA SUSP: 2.0000 | Freq: Every day | NASAL | 2 refills | Status: DC

## 2019-07-12 NOTE — Patient Instructions (Signed)
Para la irritacin y el flujo vaginal, analizamos su orina y secreciones vaginales. Lo llamaremos para informarle los resultados y el tratamiento segn sea necesario.   Para sus alergias, solicitamos un aerosol nasal recetado y [place] pastilla para la [employer], [employer].

## 2019-07-12 NOTE — Assessment & Plan Note (Signed)
-  Flonase and Xyzal -Follow-up as needed

## 2019-07-12 NOTE — Assessment & Plan Note (Addendum)
Recurrent.  Attributed to yeast infection in January.  Wet prep negative for yeast. -Advised to wear loose fitting clothing and cotton underwear -Avoid shaving and other skin irritants such as soaps and lotions -GC chlamydia pending -Urinalysis pending

## 2019-07-12 NOTE — Progress Notes (Signed)
    SUBJECTIVE:   CHIEF COMPLAINT / HPI:   Due to language barrier, an interpreter was present during the history-taking and subsequent discussion (and for part of the physical exam) with this patient.  Vaginal discomfort, recurrent Patient presents with recurrent vaginal discomfort current vaginal discomfort.  Denies any discharge.  Says that the pain occurs after going running if she has not taken shower.  Will have vaginal irritation.  Also endorses dysuria and increased urine urgency.  Denies abdominal pain, nausea, vomiting, back pain, fevers, chills, hematuria  Seasonal allergies Patient is an avid runner.  She exercises outside.  She has noticed over the past week she has had ear fullness, nasal drainage, sore throat.  Historically she has taken antihistamines and use nasal spray.  She is not currently.  She believes her symptoms are related to allergies.  Denies any cough, fevers, shortness of breath.  PERTINENT  PMH / PSH:  Seasonal allergies  OBJECTIVE:   BP 118/64   Pulse 61   Wt 133 lb 6.4 oz (60.5 kg)   SpO2 100%   BMI 26.05 kg/m   Gen: NAD, resting comfortably GI: Soft, Nontender, Nondistended. MSK: no edema, cyanosis, or clubbing noted Skin: warm, dry Neuro: grossly normal, moves all extremities Psych: Normal affect and thought content GU: The external genitalia appeared to be normal. The pelvic exam revealed no adnexal masses. The uterus appeared to be normal in size and there was no cervical motion tenderness. There is white exam discharge on exam. Exam was chaparoned by [place] April.   ASSESSMENT/PLAN:   No problem-specific Assessment & Plan notes found for this encounter.     Garnette Gunner, MD [facility]

## 2019-07-12 NOTE — Progress Notes (Signed)
Please call patient and inform her that her UA was negative for infection.

## 2019-07-12 NOTE — Addendum Note (Signed)
Addended by: Garnette Gunner on: 07/12/2019 03:35 PM   Modules accepted: Orders

## 2019-07-12 NOTE — Addendum Note (Signed)
Addended by: Lamonte Sakai, Eusebia Grulke D on: 07/12/2019 03:52 PM   Modules accepted: Orders

## 2019-07-13 ENCOUNTER — Telehealth: Payer: Self-pay | Admitting: *Deleted

## 2019-07-13 LAB — CERVICOVAGINAL ANCILLARY ONLY
Chlamydia: NEGATIVE
Comment: NEGATIVE
Comment: NORMAL
Neisseria Gonorrhea: NEGATIVE

## 2019-07-13 NOTE — Telephone Encounter (Signed)
-----   Message from Garnette Gunner, MD sent at 07/12/2019  4:49 PM EST ----- Please call patient and inform her that her UA was negative for infection.

## 2019-07-13 NOTE — Telephone Encounter (Signed)
Pt informed of below.Colleen Thornton, CMA ? ?

## 2019-07-15 ENCOUNTER — Telehealth: Payer: Self-pay | Admitting: *Deleted

## 2019-07-15 NOTE — Telephone Encounter (Signed)
Contacted pt and informed her of below.  Also she was given information to schedule a covid test due to a co-worker possibly having covid.Colleen Thornton, CMA

## 2019-07-15 NOTE — Telephone Encounter (Signed)
-----   Message from Garnette Gunner, MD sent at 07/15/2019  8:54 AM EST ----- Patient GC/Chlamydia normal. Please call and inform.

## 2019-07-16 ENCOUNTER — Ambulatory Visit: Payer: Self-pay | Attending: Internal Medicine

## 2019-07-16 DIAGNOSIS — Z20822 Contact with and (suspected) exposure to covid-19: Secondary | ICD-10-CM

## 2019-07-17 LAB — NOVEL CORONAVIRUS, NAA: SARS-CoV-2, NAA: NOT DETECTED

## 2019-07-18 ENCOUNTER — Telehealth: Payer: Self-pay

## 2019-07-18 NOTE — Telephone Encounter (Signed)
Called and informed patient that test for Covid 19 was NEGATIVE. Discussed signs and symptoms of Covid 19 : fever, chills, respiratory symptoms, cough, ENT symptoms, sore throat, SOB, muscle pain, diarrhea, headache, loss of taste/smell, close exposure to COVID-19 patient. Pt instructed to call PCP if they develop the above signs and sx. Pt also instructed to call 911 if having respiratory issues/distress. Discussed MyChart enrollment. Pt verbalized understanding. Pt is having head and ear pressure and sore throat. Pt has seen doctor for this and is taking medication and nasal spray. Advised pt to start salt water gargles 4 times a day and to suck on hard candies.Advised pt to call PCP if worsens. Pt verbalized understanding. Used interpreter # [id number].

## 2019-07-19 ENCOUNTER — Other Ambulatory Visit: Payer: Self-pay | Admitting: *Deleted

## 2019-07-19 DIAGNOSIS — Z20822 Contact with and (suspected) exposure to covid-19: Secondary | ICD-10-CM

## 2019-07-20 LAB — NOVEL CORONAVIRUS, NAA: SARS-CoV-2, NAA: NOT DETECTED

## 2019-09-20 ENCOUNTER — Other Ambulatory Visit: Payer: Self-pay

## 2019-09-20 ENCOUNTER — Encounter: Payer: Self-pay | Admitting: Family Medicine

## 2019-09-20 ENCOUNTER — Telehealth (INDEPENDENT_AMBULATORY_CARE_PROVIDER_SITE_OTHER): Payer: Self-pay | Admitting: Family Medicine

## 2019-09-20 DIAGNOSIS — Z3009 Encounter for other general counseling and advice on contraception: Secondary | ICD-10-CM

## 2019-09-20 NOTE — Progress Notes (Signed)
[facility] Telemedicine Visit  Patient consented to have virtual visit and was identified by name and date of birth. Method of visit: Video  Encounter participants: Patient: Colleen Thornton - located at home Provider: Allayne Stack - located at [facility]  Others (if applicable): none   Chief Complaint: discuss IUD   HPI: Colleen Thornton is a 34 year old female presenting via video to discuss an IUD. Previously had the Mirena IUD, removed in 02/2018 due to expiration.  Previously had the IUD scholarship through our clinic, does not have any insurance.  Tolerated the IUD very well and would like to do it again.  Sexually active with her husband.  Currently using condoms for contraception, previously prescribed OCPs but they made her nauseous and gained weight with a Depo injection.  Does not want any alternative contraception in the meantime.  Spanish interpreter was used for the duration of the visit.  ROS: per HPI  Pertinent PMHx: History of recurrent vaginal discomfort, carpal tunnel syndrome  Exam:  There were no vitals taken for this visit.  General: No acute distress Respiratory: Unlabored breathing, speaking in full sentences  Assessment/Plan:  Encounter for counseling regarding contraception Patient desires Mirena IUD, tolerated well in the past, declines alternative contraception in the meantime besides condom use after discussing available options. Will provide her with Mirena scholarship paperwork to be available at the front desk by tomorrow, 5/18, to complete at her convenience.  Will schedule following approval.    Time spent during visit with patient: 10 minutes  Allayne Stack, DO

## 2019-09-20 NOTE — Progress Notes (Signed)
Attempted to reach pt through interpreter carlos id# [id number].  Was able to ask check in questions. Aquilla Solian, CMA

## 2019-09-20 NOTE — Assessment & Plan Note (Signed)
Patient desires Mirena IUD, tolerated well in the past, declines alternative contraception in the meantime besides condom use after discussing available options. Will provide her with Mirena scholarship paperwork to be available at the front desk by tomorrow, 5/18, to complete at her convenience.  Will schedule following approval.

## 2019-10-28 ENCOUNTER — Ambulatory Visit (INDEPENDENT_AMBULATORY_CARE_PROVIDER_SITE_OTHER): Payer: Self-pay | Admitting: Family Medicine

## 2019-10-28 ENCOUNTER — Other Ambulatory Visit: Payer: Self-pay

## 2019-10-28 VITALS — BP 110/70 | HR 60 | Wt 137.0 lb

## 2019-10-28 DIAGNOSIS — Z3200 Encounter for pregnancy test, result unknown: Secondary | ICD-10-CM

## 2019-10-28 LAB — POCT URINE PREGNANCY: Preg Test, Ur: POSITIVE — AB

## 2019-10-28 MED ORDER — PRENATAL VITAMIN 27-0.8 MG PO TABS: 1.0000 | ORAL_TABLET | Freq: Every day | ORAL | 3 refills | Status: DC

## 2019-10-28 NOTE — Patient Instructions (Addendum)
Thank you for coming to see me today. It was a pleasure! Today we talked about:   Please schedule an initial pre-natal visit with our clinic. To enroll in the [facility]Mom Program, you should contact Elbia Altamirez at [phone] for an appointment. Please start taking a prenatal vitamin daily.   If you have any questions or concerns, please do not hesitate to call the office at [phone].  Take Care,   [place] Raef Sprigg, DO  Primer trimestre de Media planner First Trimester of Pregnancy  El primer trimestre de Media planner se extiende desde la semana1 hasta el final de la semana13 (mes1 al mes3). Durante este tiempo, el beb comenzar a desarrollarse dentro suyo. Entre la semana6 y Luther, se forman los ojos y Financial risk analyst, y los latidos del corazn pueden escucharse en la ecografa. Al final de las 12semanas, todos los rganos del beb estn formados. El cuidado prenatal es toda la asistencia mdica que usted recibe antes del nacimiento del beb. Asegrese de recibir un buen cuidado prenatal y de seguir todas las indicaciones del mdico. Siga estas indicaciones en su casa: [place] los medicamentos de venta libre y los recetados solamente como se lo haya indicado el mdico. Algunos medicamentos son seguros para tomar durante el Media planner y otros no lo son.  Tome vitaminas prenatales que contengan por lo menos [id number] (?g) de cido flico.  Si tiene problemas para defecar (estreimiento), tome un medicamento que ablanda la materia fecal (laxante), siempre que lo autorice el mdico. Comida y bebida   Ingiera alimentos saludables de [place] regular.  El Buyer, retail la cantidad de peso que [place].  No coma carne cruda ni quesos sin cocinar.  Si tiene Higher education careers adviser (nuseas) o vomita (vmitos): ? Ingiera 4 o 5comidas pequeas por TEFL teacher de 3abundantes. ? Intente comer algunas galletitas saladas. ? Beba lquidos [employer], en lugar de  [employer].  Para evitar el estreimiento: ? Consuma alimentos ricos en fibra, como frutas y verduras frescas, cereales integrales y legumbres. ? Beba suficiente lquido para mantener el pis (orina) claro o de color amarillo plido. Actividad  Haga ejercicios solamente como se lo haya indicado el mdico. Deje de hacer ejercicios si tiene clicos o dolor en la parte baja del vientre (abdomen) o en la cintura.  No haga actividad fsica si el clima est demasiado caluroso o hmedo, o si se encuentra en un lugar muy alto (altitud elevada).  Intente no estar de pie [employer]. Mueva las piernas con frecuencia si debe estar de pie en un lugar durante mucho tiempo.  Evite levantar pesos [employer].  Use zapatos con tacones bajos. Mantenga una buena postura al sentarse y pararse.  Puede tener [employer], a menos que el mdico le indique lo contrario. Alivio del dolor y del Tree surgeon  Use un sostn que le brinde buen soporte si le duelen las [place].  Dese baos de asiento con agua tibia para Best boy o las molestias causadas por las hemorroides. Use una crema antihemorroidal si el mdico se lo permite.  Descanse con las piernas elevadas si tiene calambres o dolor de cintura.  Si tiene las venas de las piernas hinchadas y abultadas (venas varicosas): ? Use medias elsticas de soporte o medias de compresin como se lo haya indicado el mdico. ? Levante (eleve) los pies durante 78minutos, 3 o 4veces por Training and development officer. ? Limite la sal en sus alimentos. Cuidado prenatal  Programe las visitas prenatales para la semana12 de  embarazo.  Escriba sus preguntas. Llvelas cuando concurra a las visitas prenatales.  Concurra a todas las visitas prenatales como se lo haya indicado el mdico. Esto es importante. Seguridad  Use el cinturn de seguridad en todo momento mientras conduce.  Haga una lista con los nmeros de telfono en caso de Associate Professor. Esta lista debe incluir  los nmeros de los familiares, los amigos, el hospital y los departamentos de polica y de bomberos. Instrucciones generales  Pdale al mdico que la derive a clases prenatales en su localidad. Debe comenzar a tomar las clases antes de Cytogeneticist en el mes6 de embarazo.  Pida ayuda si necesita asesoramiento o asistencia con la alimentacin. El mdico puede aconsejarla o indicarle dnde recurrir para recibir [place].  No se d baos de inmersin en agua caliente, baos turcos ni saunas.  No se haga duchas vaginales ni use tampones o toallas higinicas perfumadas.  No mantenga las piernas cruzadas durante [place].  Evite las hierbas y el alcohol. Evite los frmacos que el mdico no haya autorizado.  No consuma ningn producto que contenga tabaco, lo que incluye cigarrillos, tabaco de Theatre manager o Administrator, Civil Service. Si necesita ayuda para dejar de fumar, consulte al [employer]. Puede recibir asesoramiento u otro tipo de apoyo para dejar de fumar.  Evite el contacto con las bandejas sanitarias de los gatos y la tierra que estos animales usan. Estos elementos contienen grmenes que pueden causar defectos congnitos al beb y la posible prdida del feto (aborto espontneo) o muerte fetal.  Visite al dentista. En su casa, lvese los dientes con un cepillo dental suave. Psese el hilo dental con suavidad. Comunquese con un mdico si:  Tiene mareos.  Tiene clicos leves o siente presin en la parte baja del vientre.  Sufre un dolor persistente en el abdomen.  Sigue teniendo [employer], vomita o la materia fecal es lquida (diarrea).  Nota una secrecin de lquido con olor ftido que proviene de la vagina.  Tiene dolor al hacer pis (orinar).  Tiene el rostro, las [place], las piernas o los tobillos ms hinchados (inflamados). Solicite ayuda de inmediato si:  Tiene fiebre.  Tiene una prdida de lquido por la vagina.  Tiene sangrado o pequeas prdidas vaginales.  Tiene clicos  o dolor muy intensos en el vientre.  Sube o baja de peso rpidamente.  Vomita sangre. Esto tiene Art gallery manager similar a la borra del caf.  Est en contacto con personas que tienen rubola, la quinta enfermedad o varicela.  Siente un dolor de cabeza muy intenso.  Le falta el aire.  Sufre cualquier tipo de traumatismo, por ejemplo, debido a una cada o un accidente automovilstico. Resumen  El primer trimestre de Psychiatrist se extiende desde la semana1 hasta el final de la semana13 (mes1 al mes3).  Para cuidar su salud y la del beb en gestacin, necesitar consumir alimentos saludables, tomar medicamentos solamente si lo autoriza el mdico, y Radio producer actividades que sean seguras para usted y para su beb.  Concurra a todas las visitas de control como se lo haya indicado el mdico. Esto es importante porque el mdico deber asegurar que el beb est saludable y est creciendo bien. Esta informacin no tiene Theme park manager el consejo del mdico. Asegrese de hacerle al mdico cualquier pregunta que tenga. Document Revised: 11/26/2016 Document Reviewed: 11/26/2016 Elsevier Patient Education  2020 [employer].

## 2019-10-28 NOTE — Progress Notes (Addendum)
   SUBJECTIVE:   CHIEF COMPLAINT / HPI:   Concern for pregnancy: LMP during the first week of May, believes it was on 09/06/2019. Period was normal for her. She was using condoms but she had an appointment to have the IUD.  She then missed her next period and started having nausea and fatigue.  She has not started PNV but plans to.  She will be using adopt a mom. Second dose of covid vaccine was on May 15 and she wants to be sure that this is okay.  Patient states that this is a surprise pregnancy as she was not planning it however her husband and her are happy.  She has 3 other girls at home.  They were all born via SVD with no complications.  Patient does not have a cat at home.   PERTINENT  PMH / PSH: G3 P3-0-0-3  OBJECTIVE:  BP 110/70   Pulse 60   Wt 137 lb (62.1 kg)   LMP 09/06/2019 (Approximate)   SpO2 98%   BMI 26.76 kg/m   General: NAD, pleasant Neck: Supple Respiratory:  normal work of breathing Psych: AOx3, appropriate affect  ASSESSMENT/PLAN:   Pregnancy Patient with positive urine pregnancy test.  She is to call and establish care with [facility] and then be seen in our clinic.  Did not draw any labs today until she can establish care with [facility].  Patient encouraged to take prenatal vitamin and prescription was sent to pharmacy.  She is not currently taking any other medications which are not safe during pregnancy.  Encouraged to follow-up and schedule initial OB appointment once she has been set up with [facility].    [place] Jamilyn Pigeon, DO PGY-3, [facility]

## 2019-11-01 NOTE — Assessment & Plan Note (Signed)
Patient with positive urine pregnancy test.  She is to call and establish care with [facility] and then be seen in our clinic.  Did not draw any labs today until she can establish care with [facility].  Patient encouraged to take prenatal vitamin and prescription was sent to pharmacy.  She is not currently taking any other medications which are not safe during pregnancy.  Encouraged to follow-up and schedule initial OB appointment once she has been set up with [facility].

## 2019-11-16 ENCOUNTER — Encounter: Payer: Self-pay | Admitting: General Practice

## 2019-11-19 ENCOUNTER — Encounter (HOSPITAL_COMMUNITY): Payer: Self-pay | Admitting: Obstetrics and Gynecology

## 2019-11-19 ENCOUNTER — Inpatient Hospital Stay (HOSPITAL_COMMUNITY): Payer: Self-pay

## 2019-11-19 ENCOUNTER — Inpatient Hospital Stay (HOSPITAL_COMMUNITY)
Admission: AD | Admit: 2019-11-19 | Discharge: 2019-11-19 | Disposition: A | Discharge status: Home / Self Care | Payer: Self-pay | Attending: Obstetrics and Gynecology | Admitting: Obstetrics and Gynecology

## 2019-11-19 ENCOUNTER — Other Ambulatory Visit: Payer: Self-pay

## 2019-11-19 DIAGNOSIS — O209 Hemorrhage in early pregnancy, unspecified: Secondary | ICD-10-CM | POA: Insufficient documentation

## 2019-11-19 DIAGNOSIS — O3680X0 Pregnancy with inconclusive fetal viability, not applicable or unspecified: Secondary | ICD-10-CM

## 2019-11-19 DIAGNOSIS — O3091 Multiple gestation, unspecified, first trimester: Secondary | ICD-10-CM | POA: Insufficient documentation

## 2019-11-19 DIAGNOSIS — B3731 Acute candidiasis of vulva and vagina: Secondary | ICD-10-CM

## 2019-11-19 DIAGNOSIS — O98811 Other maternal infectious and parasitic diseases complicating pregnancy, first trimester: Secondary | ICD-10-CM | POA: Insufficient documentation

## 2019-11-19 DIAGNOSIS — B373 Candidiasis of vulva and vagina: Secondary | ICD-10-CM | POA: Insufficient documentation

## 2019-11-19 DIAGNOSIS — O4690 Antepartum hemorrhage, unspecified, unspecified trimester: Secondary | ICD-10-CM

## 2019-11-19 DIAGNOSIS — O4691 Antepartum hemorrhage, unspecified, first trimester: Secondary | ICD-10-CM

## 2019-11-19 DIAGNOSIS — Z3A10 10 weeks gestation of pregnancy: Secondary | ICD-10-CM | POA: Insufficient documentation

## 2019-11-19 DIAGNOSIS — O3680X1 Pregnancy with inconclusive fetal viability, fetus 1: Secondary | ICD-10-CM | POA: Insufficient documentation

## 2019-11-19 DIAGNOSIS — Z6790 Unspecified blood type, Rh positive: Secondary | ICD-10-CM | POA: Insufficient documentation

## 2019-11-19 LAB — COMPREHENSIVE METABOLIC PANEL
ALT: 18 U/L (ref 0–44)
AST: 24 U/L (ref 15–41)
Albumin: 4 g/dL (ref 3.5–5.0)
Alkaline Phosphatase: 51 U/L (ref 38–126)
Anion gap: 8 (ref 5–15)
BUN: 7 mg/dL (ref 6–20)
CO2: 23 mmol/L (ref 22–32)
Calcium: 10.3 mg/dL (ref 8.9–10.3)
Chloride: 106 mmol/L (ref 98–111)
Creatinine, Ser: 0.7 mg/dL (ref 0.44–1.00)
GFR calc Af Amer: >60 mL/min (ref >60)
GFR calc non Af Amer: >60 mL/min (ref >60)
Glucose, Bld: 89 mg/dL (ref 70–99)
Potassium: 4.1 mmol/L (ref 3.5–5.1)
Sodium: 137 mmol/L (ref 135–145)
Total Bilirubin: 0.4 mg/dL (ref 0.3–1.2)
Total Protein: 6.9 g/dL (ref 6.5–8.1)

## 2019-11-19 LAB — CBC
HCT: 38.5 % (ref 36.0–46.0)
Hemoglobin: 13.2 g/dL (ref 12.0–15.0)
MCH: 30.6 pg (ref 26.0–34.0)
MCHC: 34.3 g/dL (ref 30.0–36.0)
MCV: 89.1 fL (ref 80.0–100.0)
Platelets: 162 10*3/uL (ref 150–400)
RBC: 4.32 MIL/uL (ref 3.87–5.11)
RDW: 12.9 % (ref 11.5–15.5)
WBC: 5 10*3/uL (ref 4.0–10.5)
nRBC: 0 % (ref 0.0–0.2)

## 2019-11-19 LAB — URINALYSIS, ROUTINE W REFLEX MICROSCOPIC
Bilirubin Urine: NEGATIVE
Glucose, UA: NEGATIVE mg/dL
Ketones, ur: NEGATIVE mg/dL
Leukocytes,Ua: NEGATIVE
Nitrite: NEGATIVE
Protein, ur: NEGATIVE mg/dL
Specific Gravity, Urine: 1.013 (ref 1.005–1.030)
pH: 6 (ref 5.0–8.0)

## 2019-11-19 LAB — WET PREP, GENITAL
Clue Cells Wet Prep HPF POC: NONE SEEN
Sperm: NONE SEEN
Trich, Wet Prep: NONE SEEN

## 2019-11-19 LAB — HCG, QUANTITATIVE, PREGNANCY: hCG, Beta Chain, Quant, S: 12911 m[IU]/mL — ABNORMAL HIGH (ref <5)

## 2019-11-19 MED ORDER — TERCONAZOLE 0.4 % VA CREA: 1.0000 | TOPICAL_CREAM | Freq: Every day | VAGINAL | 0 refills | Status: DC

## 2019-11-19 NOTE — Discharge Instructions (Signed)
Aborto espontneo Miscarriage El aborto espontneo es la prdida de un beb que no ha nacido (feto) antes de la semana20 del embarazo. La mayor parte de los abortos espontneos ocurre durante los primeros 3meses de embarazo. A veces, un aborto ocurre antes de que la mujer sepa que est embarazada. El aborto espontneo puede ser una experiencia que afecte emocionalmente a la persona. Si ha sufrido un aborto espontneo, hable con su mdico y hgale las preguntas que tenga sobre el aborto espontneo, el proceso de duelo y los planes futuros de embarazo. Cules son las causas? Entre las causas de un aborto espontneo se incluyen las siguientes:  Problemas genticos o cromosmicos del feto. Estos problemas impiden que el beb se desarrolle con normalidad. En general, son el resultado de errores fortuitos que ocurren en la etapa temprana del desarrollo y que no se transmiten de padres a hijos (no se heredan).  Infeccin en el cuello del tero.  Trastornos que afectan el equilibrio hormonal del organismo.  Problemas en el cuello del tero, como su adelgazamiento y apertura antes de que el embarazo llegue a trmino (insuficiencia del cuello de tero).  Problemas en el tero. Estos pueden incluir, entre otros, los siguientes: ? Forma anormal del tero. ? Fibromas en el tero. ? Anormalidades congnitas. Estos son problemas que ya estaban presentes en el nacimiento.  Ciertas enfermedades crnicas.  Fumar, beber alcohol o usar drogas.  Lesiones (traumatismos). En muchos de los casos, se desconoce la causa de los abortos espontneos. Cules son los signos o los sntomas? Los sntomas de esta afeccin incluyen los siguientes:  Sangrado o manchado vaginal, con o sin clicos o dolor.  Dolor o clicos en el abdomen o en la parte inferior de la espalda.  Eliminacin de lquido, tejidos o cogulos sanguneos por la vagina. Cmo se diagnostica? Esta afeccin se puede diagnosticar en funcin de  lo siguiente:  Examen fsico.  Ecografa.  Anlisis de sangre.  Anlisis de orina. Cmo se trata? En algunos casos, el tratamiento de un aborto espontneo no es necesario si se eliminan de forma natural todos los tejidos que se encontraban en el tero. Si fuera necesario realizar un tratamiento por esta afeccin, este puede incluir lo siguiente:  Dilatacin y curetaje (D&C). Mediante este procedimiento, se expande el cuello del tero y se raspan las paredes (endometrio). Esto se realiza solamente si queda tejido del feto o la placenta dentro del cuerpo (aborto espontneo incompleto).  Medicamentos, por ejemplo: ? Antibiticos para tratar una infeccin. ? Medicamentos para ayudar al cuerpo a eliminar los restos de tejido. ? Medicamentos para reducir (contraer) el tamao del tero. Estos medicamentos se pueden administrar si tiene un sangrado abundante. Si su factor sanguneo es Rhnegativo y el de su beb es Rhpositivo, usted necesitar una inyeccin del medicamento llamado inmunoglobulinaRhpara proteger a los bebs futuros de tener problemas con el factorsanguneoRh. Los trminos "Rhnegativo" y "Rhpositivo" hacen referencia a la presencia o no en la sangre de una protena especfica que se encuentra en la superficie de los glbulos rojos (factor Rh). Siga estas indicaciones en su casa: Medicamentos   Tome los medicamentos de venta libre y los recetados solamente como se lo haya indicado el mdico.  Si le recetaron antibiticos, tmelos como se lo haya indicado el mdico. No deje de tomar los antibiticos aunque comience a sentirse mejor.  No tome antiinflamatorios no esteroideos (AINE), tales como aspirina e ibuprofeno, a menos que se lo indique el mdico. Estos medicamentos pueden provocarle sangrado. Actividad  Haga   reposo segn lo indicado. Pregntele al mdico qu actividades son seguras para usted.  Pdale a alguien que la ayude con las responsabilidades familiares y del  hogar durante este tiempo. Instrucciones generales  Lleve un registro de la cantidad y la saturacin de las toallas higinicas que Landscape architect. Anote esta informacin.  Anote la cantidad de tejido o cogulos sanguneos que expulsa por la vagina. Guarde las cantidades grandes de tejidos para que el [employer] examine.  No use tampones, no se haga duchas vaginales ni tenga relaciones sexuales hasta que el mdico la autorice.  Para que usted y su pareja puedan sobrellevar el proceso del duelo, hable con su mdico o busque apoyo psicolgico.  Cuando est lista, visite a su mdico para hablar sobre los pasos importantes que deber seguir en relacin con su salud. Tambin hable [employer] que deber tomar para tener un embarazo saludable en el futuro.  Concurra a todas las visitas de [address] se lo haya indicado el mdico. Esto es importante. Dnde encontrar ms informacin  Colegio Estadounidense de [place] y Insurance claims handler of Obstetricians and Gynecologists): www.acog.org  Departamento de Salud y [address] de los [address], [place] de Salud de Architectural technologist (U.S. Department of Health and [employer], Office on [address]): [email or web address]/ [employer] con un mdico si:  Tiene fiebre o siente escalofros.  Tiene una secrecin vaginal con mal olor.  El sangrado aumenta en vez de disminuir. Solicite ayuda de inmediato si:  Siente calambres intensos o dolor en la espalda o en el abdomen.  Elimina cogulos de sangre o tejido por la vagina del tamao de una nuez o ms grandes.  Necesita ms de una toalla higinica de tamao regular por hora.  Se siente mareada o dbil.  Se desmaya.  Siente una tristeza que la invade o Archivist. Resumen  La mayor parte de los abortos espontneos ocurre durante los primeros de Psychiatrist. En algunos casos, el aborto espontneo ocurre antes de que la mujer sepa que est  [place].  Siga las indicaciones del mdico para el cuidado Facilities manager. Concurra a todas las visitas de control.  Para que usted y su pareja puedan sobrellevar el proceso del duelo, hable con su mdico o busque apoyo psicolgico. Esta informacin no tiene Theme park manager el consejo del mdico. Asegrese de hacerle al mdico cualquier pregunta que tenga. Document Revised: 01/27/2017 Document Reviewed: 01/27/2017 Elsevier Patient Education  2020 [employer].        Amenaza de aborto Threatened Miscarriage  La amenaza de aborto se produce cuando una mujer tiene hemorragia vaginal durante las primeras 20semanas de [place], pero el embarazo no se interrumpe. Si durante este perodo usted tiene hemorragia vaginal, el mdico le har pruebas para asegurarse de que el embarazo contine. Si las pruebas muestran que usted contina embarazada y que el "beb" en desarrollo (feto) dentro del tero sigue creciendo, se considera que tuvo una [place] de aborto. La amenaza de aborto no implica que el embarazo vaya a Midwife, pero s aumenta el riesgo de perder el embarazo (aborto completo). Cules son las causas? Por lo general, se desconoce la causa de esta afeccin. Si el resultado final es el aborto completo, la causa ms frecuente es la cantidad anormal de cromosomas del feto. Los cromosomas son las estructuras internas de las clulas que contienen todo el material gentico de Medical laboratory scientific officer. Qu incrementa el riesgo? Los siguientes factores del estilo de vida pueden aumentar el riesgo de  aborto al comienzo del embarazo:  Fumar.  El consumo de cantidades excesivas de alcohol o cafena.  El consumo de drogas. Las siguientes enfermedades preexistentes pueden aumentar el riesgo de aborto al comienzo del embarazo:  Sndrome del ovario poliqustico.  Fibromas uterinos.  Infecciones.  Diabetes mellitus. Cules son los signos o los sntomas? Los sntomas de esta afeccin incluyen los  siguientes:  Hemorragia vaginal.  Dolor o clicos abdominales leves. Cmo se diagnostica? Si tiene hemorragia con o sin dolor abdominal antes de las 20semanas de [place]barazo, el mdico le har pruebas para determinar si el embarazo contina. Estas incluirn lo siguiente:  Ecografa. Este estudio Botswanausa ondas sonoras para crear imgenes del interior del tero. Esto permite que el mdico vea al beb en gestacin y otras estructuras, como la placenta.  Examen plvico. Este es un examen interno de la vagina y del cuello uterino.  Medicin de la frecuencia cardaca del feto.  Pruebas de laboratorio, como anlisis de Sund[place]angre, Los Ebanosanlisis de [place] o hisopados para Engineer, manufacturingdetectar una infeccin. Es posible que le diagnostiquen una amenaza de aborto en los siguientes casos:  La ecografa muestra que el embarazo contina.  La frecuencia cardaca del feto es alta.  El examen plvico muestra que la apertura entre el tero y la vagina (cuello uterino) est cerrada.  Los ARAMARK Corporationanlisis de sangre confirman que el embarazo contina. Cmo se trata? No se ha demostrado que ningn tratamiento evite que una amenaza de aborto se [place] and Tobagoconvierta en un aborto completo. Sin embargo, los cuidados Starbucks Corporationadecuados en el hogar son importantes. Siga estas indicaciones en su casa:  Descanse lo suficiente.  No tenga relaciones sexuales ni use tampones si tiene hemorragia vaginal.  No se haga duchas vaginales.  No fume ni consuma drogas.  No beba alcohol.  Evite la cafena.  Vaya a todas las visitas de control prenatales y de control como se lo haya indicado el mdico. Esto es importante. Comunquese con un mdico si:  Tiene una ligera hemorragia o manchado vaginal durante el embarazo.  Tiene dolor o clicos en el abdomen.  Tiene fiebre. Solicite ayuda de inmediato si:  Tiene una hemorragia vaginal abundante.  Elimina cogulos de sangre por la vagina.  Elimina tejidos por la vagina.  Tiene una prdida de lquido o Scientist, research (medical)le sale  lquido a chorros por Sales executivela vagina.  Siente dolor en la parte baja de la espalda o clicos abdominales intensos.  Tiene fiebre, escalofros y dolor abdominal intenso. Resumen  La amenaza de aborto se produce cuando una mujer tiene hemorragia vaginal durante las primeras 20semanas de [place]azo, pero el embarazo no se interrumpe.  Por lo general, no se conoce la causa de la amenaza de aborto.  Entre los sntomas de esta afeccin, se incluyen hemorragia vaginal y clicos o dolor abdominal leve.  No se ha demostrado que ningn tratamiento evite que una amenaza de aborto se [place] and Tobagoconvierta en un aborto completo.  Vaya a todas las visitas de control prenatales y de control como se lo haya indicado el mdico. Esto es importante. Esta informacin no tiene Theme park managercomo fin reemplazar el consejo del mdico. Asegrese de hacerle al mdico cualquier pregunta que tenga. Document Revised: 01/10/2017 Document Reviewed: 01/10/2017 Elsevier Patient Education  2020 [employer].       Devon EnergyComo sobrellevar la prdida de Chartered loss adjusterun embarazo Managing Pregnancy Loss La prdida del Psychiatristembarazo puede ocurrir en cualquier momento durante un Psychiatristembarazo. Generalmente no se conoce la causa. Rara vez se debe a algo que usted hizo. La prdida del Vanetta Muldersembarazo a comienzos  del Charity fundraiser trimestre) se denomina aborto espontneo. Este es el tipo de prdida de un embarazo ms frecuente. La prdida del embarazo que ocurre despus de las 20 semanas de embarazo se denomina muerte fetal si el corazn del beb deja de latir antes del nacimiento. La muerte fetal es mucho menos frecuente. Algunas mujeres experimentan trabajo de parto espontneo poco despus de la muerte fetal, lo que ocasiona el nacimiento del beb muerto. Cualquier prdida de Chartered loss adjuster puede ser devastadora. Tendr que recuperarse fsica y emocionalmente. La mayora de las mujeres son capaces de quedar embarazadas nuevamente despus de la prdida de un Psychiatrist y dar a luz un  beb sano. Cmo manejar la recuperacin emocional  La prdida de un embarazo es muy difcil emocionalmente. Es posible que sienta muchas emociones distintas mientras hace el duelo. Puede sentirse triste y [place]. Tambin puede sentir culpa. Es normal tener perodos de llanto. La recuperacin emocional puede llevar ms tiempo que la recuperacin fsica. Es diferente para Advertising account planner. Estas medidas pueden ayudarla a sobrellevar esta prdida:  Recuerde que es poco probable que haya hecho algo para causar la prdida del Psychiatrist.  Comparta sus pensamientos y sentimientos con su pareja, familiares y Personnel officer. Recuerde que su pareja tambin se est recuperando emocionalmente.  Asegrese de tener un buen sistema de apoyo. No pase demasiado tiempo sola.  Renase con un consejero sobre prdida de [place] o nase a un grupo de apoyo para prdida de Insurance underwriter.  Duerma lo suficiente y siga una dieta sana. Regrese a la actividad fsica con regularidad cuando se haya recuperado fsicamente.  No consuma drogas ni alcohol para controlar sus emociones.  Considere consultar a un profesional de salud mental para que la ayude a Insurance underwriter.  Pdale a un amigo o un ser querido que la ayude a decidir qu hacer con la ropa y los artculos infantiles que recibi para el beb. En el caso de muerte fetal, muchas mujeres se benefician al tomar [employer] Rx en el proceso del duelo. Es recomendable que:  Sostenga al beb despus del nacimiento.  Le ponga un nombre al beb.  Solicite un certificado de nacimiento.  Cree un recuerdo, como las [address] [facility] de las manos o de los pies.  Vista al beb y pida que le tomen una fotografa.  Haga arreglos para Product manager.  Solicite un bautismo o una bendicin. Los hospitales tienen personal que puede ayudarla a Chief Strategy Officer todo esto. Cmo reconocer el estrs emocional Es normal tener estrs emocional despus de perder un Psychiatrist. Pero el estrs  emocional que dura mucho tiempo o se vuelve grave requiere tratamiento. Tenga cuidado con estos signos de estrs emocional grave:  Tristeza, ira o culpa, que no desaparecen y estn interfiriendo en sus actividades normales.  Problemas de relacin que hayan surgido o empeorado desde la prdida del Psychiatrist.  Signos de depresin que duran ms de 2 semanas. Pueden incluir: ? Tristeza. ? Ansiedad. ? Desesperanza. ? Falta de inters en las actividades que disfruta. ? Incapacidad para concentrarse. ? Dificultad para dormir o dormir demasiado. ? Prdida del apetito o comer en exceso. ? Pensamientos sobre la muerte o de [place] dao a s misma. Siga estas instrucciones en su casa:  Use los medicamentos de venta libre y los recetados solamente como se lo haya indicado el mdico.  Haga reposo en su casa hasta que su nivel de energa regrese. Reanude sus actividades normales segn lo indicado por el mdico. Pregntele al mdico qu actividades son seguras para  usted.  Cuando est lista, visite a su mdico para hablar [employer] debe tomar para futuros embarazos.  Concurra a todas las visitas de [address] se lo haya indicado el mdico. Esto es importante. Dnde encontrar apoyo  Para que usted y su pareja puedan sobrellevar el proceso del duelo, hable con su mdico o busque apoyo psicolgico.  Considere la posibilidad de reunirse con otras mujeres que hayan sufrido la prdida de Chartered loss adjuster. Consulte al [employer] distintos grupos de apoyo y recursos. Dnde buscar ms informacin  U.S. Department of Health and [employer], Office on [address] Health (Departamento de Salud y [place] de los [place], [place] de Salud de la Mujer): [email or web address]  American Pregnancy Association (Asociacin Americana del [place]): BroadwayMovies.se. Comunquese con un mdico si:  Contina sintiendo pena, tristeza o falta de motivacin para realizar las  actividades diarias, y estos sentimientos no mejoran con Museum/gallery conservator.  Tiene problemas para recuperarse emocionalmente, en especial si est consumiendo alcohol o sustancias para ayudar. Solicite ayuda inmediatamente si:  Piensa en lastimarse a usted misma o a Economist. Si alguna vez siente que puede lastimarse a usted misma o Physicist, medical a Economist, o tiene pensamientos de poner fin a su vida, busque ayuda de inmediato. Puede dirigirse al servicio de emergencias ms cercano o comunicarse con:  El servicio de emergencias de su localidad (911 en [place]  Una lnea de asistencia al suicida y Visual merchandiser en crisis, como National Suicide Prevention Lifeline (Lnea Nacional de Prevencin del Suicidio), al [phone]. Est disponible las 24 horas del da. Resumen  Cualquier prdida de Chartered loss adjuster puede ser difcil fsica y emocionalmente.  Es posible que tenga muchas emociones distintas mientras hace el duelo. La recuperacin emocional puede durar ms tiempo que la recuperacin fsica.  Es normal tener estrs emocional despus de perder Chartered loss adjuster. Pero el estrs emocional que dura mucho tiempo o se vuelve grave requiere tratamiento.  Consulte a su mdico si tiene problemas emocionales despus de la prdida de Chartered loss adjuster. Esta informacin no tiene Theme park manager el consejo del mdico. Asegrese de hacerle al mdico cualquier pregunta que tenga. Document Revised: 08/17/2018 Document Reviewed: 08/02/2017 Elsevier Patient Education  2020 [employer]vin[employer].

## 2019-11-19 NOTE — Progress Notes (Unsigned)
Patient and daughter walk- in to [facility] regarding abdominal cramping and vaginal bleeding. Daughter reports that mother is about two months pregnant. No available appointments this AM, advised that patient report to [facility]U for further evaluation. Patient does not appear to be in distress at this time. Will report to MAU with daughter for further evaluation.   Veronda Prude, RN

## 2019-11-19 NOTE — MAU Note (Signed)
Pt reports vaginal bleeding off/on yesterday but has been constant since 2 am today. Reports cramping in left lower abd.

## 2019-11-19 NOTE — MAU Provider Note (Signed)
History     CSN: [id number]  Arrival date and time: 11/19/19 1057   First Provider Initiated Contact with Patient 11/19/19 1136      Chief Complaint  Patient presents with  . Vaginal Bleeding   [name] is a 34 y.o. [phone] at [redacted]w[redacted]d who presents to MAU for vaginal bleeding which began around 1030AM yesterday. Patient describes bleeding as intermittent. Patient reports she passed something that looks like a gestational sac and is concerned that she miscarried. Patient reports bleeding is only when wiping at this time. Patient endorses very tiny clots.  Passing blood clots? Per above Blood soaking clothes? no Lightheaded/dizzy? no Significant pelvic pain or cramping? Endorses cramping, mostly on the left side Passed any tissue? questionable  Current pregnancy problems? Pt has not yet been seen Blood Type? O Positive Allergies? NKDA Current medications? PNVs Current PNC & next appt? [facility], 12/06/2019  Pt denies vaginal discharge/odor/itching. Pt denies N/V, abdominal pain, constipation, diarrhea, or urinary problems. Pt denies fever, chills, fatigue, sweating or changes in appetite. Pt denies SOB or chest pain. Pt denies dizziness, HA, light-headedness, weakness.  Spanish interpreter used for entire visit.   OB History    Gravida  4   Para  3   Term  3   Preterm      AB      Living  3     SAB      TAB      Ectopic      Multiple      Live Births  3           Past Medical History:  Diagnosis Date  . Gestational diabetes    G2 only - diet controlled  . History of gestational diabetes 04/01/2017    History reviewed. No pertinent surgical history.  Family History  Problem Relation Age of Onset  . Diabetes Father   . Hyperlipidemia Sister   . Diabetes Paternal Grandmother     Social History   Tobacco Use  . Smoking status: Never Smoker  . Smokeless tobacco: Never Used  Vaping Use  . Vaping Use: Never used  Substance  Use Topics  . Alcohol use: No  . Drug use: No    Allergies: No Known Allergies  Medications Prior to Admission  Medication Sig Dispense Refill Last Dose  . Prenatal Vit-Fe Fumarate-FA (PRENATAL VITAMIN) 27-0.8 MG TABS Take 1 tablet by mouth daily. 90 tablet 3 11/19/2019 at Unknown time  . fluticasone (FLONASE) 50 MCG/ACT nasal spray Place 2 sprays into both nostrils daily. (Patient not taking: Reported on 10/28/2019) 18.2 mL 2     Review of Systems  Constitutional: Negative for chills, diaphoresis, fatigue and fever.  Eyes: Negative for visual disturbance.  Respiratory: Negative for shortness of breath.   Cardiovascular: Negative for chest pain.  Gastrointestinal: Negative for abdominal pain, constipation, diarrhea, nausea and vomiting.  Genitourinary: Positive for pelvic pain (cramping, mostly left-sided) and vaginal bleeding. Negative for dysuria, flank pain, frequency, urgency and vaginal discharge.  Neurological: Negative for dizziness, weakness, light-headedness and headaches.   Physical Exam   Blood pressure (!) 148/73, pulse 67, temperature 99.5 F (37.5 C), temperature source Oral, resp. rate 16, height 5' (1.524 m), weight 62.1 kg, last menstrual period 09/06/2019.  Patient Vitals for the past 24 hrs:  BP Temp Temp src Pulse Resp Height Weight  11/19/19 1118 (!) 148/73 99.5 F (37.5 C) Oral 67 16 5' (1.524 m) 62.1 kg   Physical Exam Constitutional:  General: She is not in acute distress.    Appearance: Normal appearance. She is normal weight. She is not ill-appearing, toxic-appearing or diaphoretic.  HENT:     Head: Normocephalic and atraumatic.  Pulmonary:     Effort: Pulmonary effort is normal.  Abdominal:     General: Abdomen is flat.  Neurological:     Mental Status: She is alert.  Psychiatric:        Mood and Affect: Mood normal.        Behavior: Behavior normal.        Thought Content: Thought content normal.        Judgment: Judgment normal.     Results for orders placed or performed during the hospital encounter of 11/19/19 (from the past 24 hour(s))  Urinalysis, Routine w reflex microscopic     Status: Abnormal   Collection Time: 11/19/19 11:03 AM  Result Value Ref Range   Color, Urine YELLOW YELLOW   APPearance CLEAR CLEAR   Specific Gravity, Urine 1.013 1.005 - 1.030   pH 6.0 5.0 - 8.0   Glucose, UA NEGATIVE NEGATIVE mg/dL   Hgb urine dipstick LARGE (A) NEGATIVE   Bilirubin Urine NEGATIVE NEGATIVE   Ketones, ur NEGATIVE NEGATIVE mg/dL   Protein, ur NEGATIVE NEGATIVE mg/dL   Nitrite NEGATIVE NEGATIVE   Leukocytes,Ua NEGATIVE NEGATIVE   RBC / HPF 6-10 0 - 5 RBC/hpf   WBC, UA 0-5 0 - 5 WBC/hpf   Bacteria, UA RARE (A) NONE SEEN   Squamous Epithelial / LPF 0-5 0 - 5   Mucus PRESENT   Wet prep, genital     Status: Abnormal   Collection Time: 11/19/19 12:04 PM   Specimen: Vaginal  Result Value Ref Range   Yeast Wet Prep HPF POC PRESENT (A) NONE SEEN   Trich, Wet Prep NONE SEEN NONE SEEN   Clue Cells Wet Prep HPF POC NONE SEEN NONE SEEN   WBC, Wet Prep HPF POC MANY (A) NONE SEEN   Sperm NONE SEEN   CBC     Status: None   Collection Time: 11/19/19 12:13 PM  Result Value Ref Range   WBC 5.0 4.0 - 10.5 K/uL   RBC 4.32 3.87 - 5.11 MIL/uL   Hemoglobin 13.2 12.0 - 15.0 g/dL   HCT 73.2 36 - 46 %   MCV 89.1 80.0 - 100.0 fL   MCH 30.6 26.0 - 34.0 pg   MCHC 34.3 30.0 - 36.0 g/dL   RDW 20.2 54.2 - 70.6 %   Platelets 162 150 - 400 K/uL   nRBC 0.0 0.0 - 0.2 %  hCG, quantitative, pregnancy     Status: Abnormal   Collection Time: 11/19/19 12:13 PM  Result Value Ref Range   hCG, Beta Chain, Quant, S 12,911 (H) <5 mIU/mL   No results found.  MAU Course  Procedures  MDM -r/o ectopic -UA: lg hgb/rare bacteria, sending urine for culture -CBC: WNL -CMP: WNL -[place]: single GS, mildly irregular shape and thin choroid decidual reaction, no yolk sac, no embryo, no SCH, suspicious, but not yet definitive for failed  pregnancy -hCG: 12,911 -ABO: O Positive -WetPrep: +yeast -GC/CT collected -consulted with Dr. Alysia Penna, pt OK to be discharged home with repeat US in 7-10 days -pt discharged to home in stable condition  Orders Placed This Encounter  Procedures  . Wet prep, genital    Standing Status:   Standing    Number of Occurrences:   1  . US OB LESS THAN 14  WEEKS WITH OB TRANSVAGINAL    Standing Status:   Standing    Number of Occurrences:   1    Order Specific Question:   Symptom/Reason for Exam    Answer:   Vaginal bleeding in pregnancy [[id number]]  . Urinalysis, Routine w reflex microscopic    Standing Status:   Standing    Number of Occurrences:   1  . CBC    Standing Status:   Standing    Number of Occurrences:   1  . Comprehensive metabolic panel    Standing Status:   Standing    Number of Occurrences:   1  . hCG, quantitative, pregnancy    Standing Status:   Standing    Number of Occurrences:   1   No orders of the defined types were placed in this encounter.   Assessment and Plan   1. Pregnancy with uncertain fetal viability, fetus 1 of multiple gestation   2. Vaginal bleeding in pregnancy   3. Blood type, Rh positive   4. Vaginal yeast infection     Allergies as of 11/19/2019   No Known Allergies     Medication List    TAKE these medications   fluticasone 50 MCG/ACT nasal spray Commonly known as: Flonase Place 2 sprays into both nostrils daily.   Prenatal Vitamin 27-0.8 MG Tabs Take 1 tablet by mouth daily.   terconazole 0.4 % vaginal cream Commonly known as: TERAZOL 7 Place 1 applicator vaginally at bedtime for 7 days.       -will call with culture results, if positive -repeat US ordered for one week -likelihood of failed pregnancy discuss -return MAU precautions given -pt discharged to home in stable condition  Joni Reining E Alene Bergerson 11/19/2019, 11:47 AM

## 2019-11-20 LAB — CULTURE, OB URINE: Culture: NO GROWTH

## 2019-11-22 LAB — GC/CHLAMYDIA PROBE AMP (~~LOC~~) NOT AT ARMC
Chlamydia: NEGATIVE
Comment: NEGATIVE
Comment: NORMAL
Neisseria Gonorrhea: NEGATIVE

## 2019-11-25 ENCOUNTER — Other Ambulatory Visit: Payer: Self-pay

## 2019-11-25 ENCOUNTER — Ambulatory Visit (INDEPENDENT_AMBULATORY_CARE_PROVIDER_SITE_OTHER): Payer: Self-pay | Admitting: Obstetrics and Gynecology

## 2019-11-25 ENCOUNTER — Other Ambulatory Visit (HOSPITAL_COMMUNITY): Payer: Self-pay | Admitting: Women's Health

## 2019-11-25 ENCOUNTER — Ambulatory Visit
Admission: RE | Admit: 2019-11-25 | Discharge: 2019-11-25 | Disposition: A | Discharge status: Home / Self Care | Payer: Self-pay | Source: Ambulatory Visit | Attending: Women's Health | Admitting: Women's Health

## 2019-11-25 ENCOUNTER — Encounter: Payer: Self-pay | Admitting: Obstetrics and Gynecology

## 2019-11-25 DIAGNOSIS — O3680X1 Pregnancy with inconclusive fetal viability, fetus 1: Secondary | ICD-10-CM

## 2019-11-25 DIAGNOSIS — Z789 Other specified health status: Secondary | ICD-10-CM

## 2019-11-25 DIAGNOSIS — O3680X0 Pregnancy with inconclusive fetal viability, not applicable or unspecified: Secondary | ICD-10-CM

## 2019-11-25 DIAGNOSIS — Z603 Acculturation difficulty: Secondary | ICD-10-CM

## 2019-11-25 NOTE — Progress Notes (Signed)
GYNECOLOGY OFFICE VISIT NOTE  History:  34 y.o. [id number] here today for results for ultrasound for fetal viability, seen in MAU for bleeding and had ultrasound suspicious for failed pregnancy. She denies any abnormal vaginal discharge, bleeding, pelvic pain or other concerns.   Past Medical History:  Diagnosis Date  . Gestational diabetes    G2 only - diet controlled  . History of gestational diabetes 04/01/2017    History reviewed. No pertinent surgical history.   Current Outpatient Medications:  .  fluticasone (FLONASE) 50 MCG/ACT nasal spray, Place 2 sprays into both nostrils daily. (Patient not taking: Reported on 10/28/2019), Disp: 18.2 mL, Rfl: 2 .  Prenatal Vit-Fe Fumarate-FA (PRENATAL VITAMIN) 27-0.8 MG TABS, Take 1 tablet by mouth daily., Disp: 90 tablet, Rfl: 3  The following portions of the patient's history were reviewed and updated as appropriate: allergies, current medications, past family history, past medical history, past social history, past surgical history and problem list.   Health Maintenance:  Last pap: 10/2016 normal Last mammogram: n/a  Review of Systems:  Pertinent items noted in HPI and remainder of comprehensive ROS otherwise negative.   Objective:  Physical Exam LMP 09/06/2019 (Approximate)  CONSTITUTIONAL: Well-developed, well-nourished female in no acute distress.  HENT:  Normocephalic, atraumatic. External right and left ear normal. Oropharynx is clear and moist EYES: Conjunctivae and EOM are normal. Pupils are equal, round, and reactive to light. No scleral icterus.  NECK: Normal range of motion, supple, no masses SKIN: Skin is warm and dry. No rash noted. Not diaphoretic. No erythema. No pallor. NEUROLOGIC: Alert and oriented to person, place, and time. Normal reflexes, muscle tone coordination. No cranial nerve deficit noted. PSYCHIATRIC: Normal mood and affect. Normal behavior. Normal judgment and thought content. CARDIOVASCULAR: Normal heart  rate noted RESPIRATORY: Effort normal, no problems with respiration noted ABDOMEN: Soft, no distention noted.   PELVIC: deferred MUSCULOSKELETAL: Normal range of motion. No edema noted.  Labs and Imaging US OB Transvaginal  Result Date: 11/25/2019 CLINICAL DATA:  First trimester pregnancy, assess viability EXAM: TRANSVAGINAL OB ULTRASOUND TECHNIQUE: Transvaginal ultrasound was performed for complete evaluation of the gestation as well as the maternal uterus, adnexal regions, and pelvic cul-de-sac. COMPARISON:  11/19/2019 FINDINGS: Intrauterine gestational sac: Present, single, irregular Yolk sac:  Absent Embryo:  Absent Cardiac Activity: N/A Heart Rate: N/A bpm MSD: 22.0 mm   7 w   1 d Subchorionic hemorrhage:  None visualized. Maternal uterus/adnexae: RIGHT ovary slight prominent size, measures 4.0 x 6.5 x 4.9 cm, without focal abnormality. LEFT ovary normal size and morphology, 2.4 x 1.6 x 1.5 cm. No free pelvic fluid or adnexal masses. Numerous prominent vessels in the LEFT adnexal region without focal adnexal mass. IMPRESSION: Irregular gestational sac seen within the uterus, lacking a fetal pole and yolk sac. Findings are suspicious but not yet definitive for failed pregnancy. Recommend follow-up US in 10-14 days for definitive diagnosis. This recommendation follows SRU consensus guidelines: Diagnostic Criteria for Nonviable Pregnancy Early in the First Trimester. Malva Limes Med 2013; 124:5809-98. Electronically Signed   By: Ulyses Southward M.D.   On: 11/25/2019 10:35   US OB LESS THAN 14 WEEKS WITH OB TRANSVAGINAL  Result Date: 11/19/2019 CLINICAL DATA:  Vaginal bleeding and cramping. First trimester pregnancy. EXAM: OBSTETRIC <14 WK [place] AND TRANSVAGINAL OB US TECHNIQUE: Both transabdominal and transvaginal ultrasound examinations were performed for complete evaluation of the gestation as well as the maternal uterus, adnexal regions, and pelvic cul-de-sac. Transvaginal technique was performed to assess  early pregnancy. COMPARISON:  None. FINDINGS: Intrauterine gestational sac: Single; mildly irregular shape and thin choroid decidual reaction noted Yolk sac:  Not Visualized. Embryo:  Not Visualized. MSD: 19 mm   6 w   5 d Subchorionic hemorrhage:  None visualized. Maternal uterus/adnexae: Both ovaries are normal in appearance. No mass or abnormal free fluid identified. IMPRESSION: Findings are suspicious but not yet definitive for failed pregnancy. Recommend follow-up US in 10-14 days for definitive diagnosis. This recommendation follows SRU consensus guidelines: Diagnostic Criteria for Nonviable Pregnancy Early in the First Trimester. Malva Limes Med 2013; 921:1941-74. Electronically Signed   By: Danae Orleans M.D.   On: 11/19/2019 13:58    Assessment & Plan:   1. Pregnancy with uncertain fetal viability, single or unspecified fetus - Reviewed ultrasound results, highly suspicious for but not meeting definitive criteria for failed pregnancy. Reviewed options to repeat US in one week versus presume failed pregnancy given minimal progression, patient opts to repeat US.  - US OB Transvaginal; Future - At patient request, discussed management of missed abortion: expectant management vs misoprostol vs D&E. She wants to be prepared in case of poor outcome again next week. Risks and benefits of all modalities discussed; all questions answered. Likely would choose misoprostol.  2. Language barrier Spanish translator used   Routine preventative health maintenance measures emphasized. Please refer to After Visit Summary for other counseling recommendations.   Return in about 1 week (around 12/02/2019) for Followup.   Total face-to-face time with patient: 22 minutes. Over 50% of encounter was spent on counseling and coordination of care.   Baldemar Lenis, M.D. Attending Center for [employer] [facility])

## 2019-12-01 ENCOUNTER — Encounter (HOSPITAL_COMMUNITY): Payer: Self-pay | Admitting: Obstetrics and Gynecology

## 2019-12-01 ENCOUNTER — Ambulatory Visit
Admission: RE | Admit: 2019-12-01 | Discharge: 2019-12-01 | Disposition: A | Discharge status: Home / Self Care | Payer: Self-pay | Source: Ambulatory Visit | Attending: Obstetrics and Gynecology | Admitting: Obstetrics and Gynecology

## 2019-12-01 ENCOUNTER — Other Ambulatory Visit: Payer: Self-pay

## 2019-12-01 ENCOUNTER — Ambulatory Visit (INDEPENDENT_AMBULATORY_CARE_PROVIDER_SITE_OTHER): Payer: Self-pay | Admitting: *Deleted

## 2019-12-01 ENCOUNTER — Inpatient Hospital Stay (HOSPITAL_COMMUNITY)
Admission: AD | Admit: 2019-12-01 | Discharge: 2019-12-01 | Disposition: A | Discharge status: Home / Self Care | Payer: Self-pay | Attending: Obstetrics and Gynecology | Admitting: Obstetrics and Gynecology

## 2019-12-01 DIAGNOSIS — O021 Missed abortion: Secondary | ICD-10-CM

## 2019-12-01 DIAGNOSIS — O034 Incomplete spontaneous abortion without complication: Secondary | ICD-10-CM | POA: Insufficient documentation

## 2019-12-01 DIAGNOSIS — Z3A01 Less than 8 weeks gestation of pregnancy: Secondary | ICD-10-CM | POA: Insufficient documentation

## 2019-12-01 DIAGNOSIS — N939 Abnormal uterine and vaginal bleeding, unspecified: Secondary | ICD-10-CM | POA: Insufficient documentation

## 2019-12-01 DIAGNOSIS — O3680X0 Pregnancy with inconclusive fetal viability, not applicable or unspecified: Secondary | ICD-10-CM | POA: Insufficient documentation

## 2019-12-01 DIAGNOSIS — O4690 Antepartum hemorrhage, unspecified, unspecified trimester: Secondary | ICD-10-CM

## 2019-12-01 DIAGNOSIS — O26891 Other specified pregnancy related conditions, first trimester: Secondary | ICD-10-CM | POA: Insufficient documentation

## 2019-12-01 DIAGNOSIS — M545 Low back pain: Secondary | ICD-10-CM | POA: Insufficient documentation

## 2019-12-01 DIAGNOSIS — R109 Unspecified abdominal pain: Secondary | ICD-10-CM | POA: Insufficient documentation

## 2019-12-01 LAB — URINALYSIS, ROUTINE W REFLEX MICROSCOPIC
Bacteria, UA: NONE SEEN
Bilirubin Urine: NEGATIVE
Glucose, UA: NEGATIVE mg/dL
Ketones, ur: NEGATIVE mg/dL
Leukocytes,Ua: NEGATIVE
Nitrite: NEGATIVE
Protein, ur: NEGATIVE mg/dL
RBC / HPF: >50 RBC/hpf — ABNORMAL HIGH (ref 0–5)
Specific Gravity, Urine: 1.012 (ref 1.005–1.030)
pH: 7 (ref 5.0–8.0)

## 2019-12-01 MED ORDER — ACETAMINOPHEN-CODEINE #3 300-30 MG PO TABS: 2.0000 | ORAL_TABLET | Freq: Once | ORAL | Status: DC

## 2019-12-01 MED ORDER — ACETAMINOPHEN-CODEINE #3 300-30 MG PO TABS: 1.0000 | ORAL_TABLET | Freq: Four times a day (QID) | ORAL | 0 refills | Status: DC | PRN

## 2019-12-01 MED ORDER — MISOPROSTOL 200 MCG PO TABS: 800.0000 ug | ORAL_TABLET | Freq: Once | ORAL | Status: DC

## 2019-12-01 MED ORDER — MISOPROSTOL 200 MCG PO TABS
800.0000 ug | ORAL_TABLET | Freq: Once | ORAL | Status: AC
  Administered 2019-12-01: 800 ug via RECTAL
  Filled 2019-12-01: qty 4

## 2019-12-01 MED ORDER — IBUPROFEN 600 MG PO TABS
600.0000 mg | ORAL_TABLET | Freq: Four times a day (QID) | ORAL | 1 refills | Status: DC | PRN
Start: 2019-12-01 — End: 2019-12-15

## 2019-12-01 MED ORDER — PROMETHAZINE HCL 25 MG PO TABS
12.5000 mg | ORAL_TABLET | Freq: Once | ORAL | Status: AC
  Administered 2019-12-01: 12.5 mg via ORAL
  Filled 2019-12-01: qty 1

## 2019-12-01 MED ORDER — ACETAMINOPHEN-CODEINE #3 300-30 MG PO TABS
1.0000 | ORAL_TABLET | Freq: Once | ORAL | Status: AC
  Administered 2019-12-01: 1 via ORAL
  Filled 2019-12-01: qty 1

## 2019-12-01 MED ORDER — PROMETHAZINE HCL 12.5 MG PO TABS: 12.5000 mg | ORAL_TABLET | Freq: Four times a day (QID) | ORAL | 0 refills | Status: DC | PRN

## 2019-12-01 NOTE — MAU Provider Note (Signed)
History     CSN: [id number]  Arrival date and time: 12/01/19 1226   Provider's first contact with patient at 1300 - In-house Spanish Interpreter Viria present for assessment and exam.    Chief Complaint  Patient presents with  . Vaginal Bleeding   [name] is a 34 y.o. year old [id number] female at [redacted]w[redacted]d weeks gestation who was sent to MAU for confirmed SAB. She complains of lower back (6/10) and lower abdominal pain (4/10). She reports moderate vaginal bleeding and passed 3 big clots in the BR.   OB History    Gravida  4   Para  3   Term  3   Preterm      AB      Living  3     SAB      TAB      Ectopic      Multiple      Live Births  3           Past Medical History:  Diagnosis Date  . Gestational diabetes    G2 only - diet controlled  . History of gestational diabetes 04/01/2017    History reviewed. No pertinent surgical history.  Family History  Problem Relation Age of Onset  . Diabetes Father   . Hyperlipidemia Sister   . Diabetes Paternal Grandmother     Social History   Tobacco Use  . Smoking status: Never Smoker  . Smokeless tobacco: Never Used  Vaping Use  . Vaping Use: Never used  Substance Use Topics  . Alcohol use: No  . Drug use: No    Allergies: No Known Allergies  Medications Prior to Admission  Medication Sig Dispense Refill Last Dose  . fluticasone (FLONASE) 50 MCG/ACT nasal spray Place 2 sprays into both nostrils daily. (Patient not taking: Reported on 10/28/2019) 18.2 mL 2 not taking  . Prenatal Vit-Fe Fumarate-FA (PRENATAL VITAMIN) 27-0.8 MG TABS Take 1 tablet by mouth daily. 90 tablet 3 not taking    Review of Systems  Constitutional: Negative.   HENT: Negative.   Eyes: Negative.   Respiratory: Negative.   Cardiovascular: Negative.   Gastrointestinal: Negative.   Endocrine: Negative.   Genitourinary: Positive for pelvic pain and vaginal bleeding (passed 3 big clots in the BR).   Musculoskeletal: Negative.   Skin: Negative.   Allergic/Immunologic: Negative.   Neurological: Negative.   Hematological: Negative.   Psychiatric/Behavioral: Negative.    Physical Exam   Blood pressure (!) 143/72, pulse 65, temperature 98.9 F (37.2 C), resp. rate 18, last menstrual period 09/06/2019, SpO2 100 %.  Physical Exam  MAU Course  Procedures  MDM Early Intrauterine Pregnancy Failure Protocol X  Documented intrauterine pregnancy failure less than or equal to [redacted] weeks   gestation  X  No serious current illness  X  Baseline Hgb greater than or equal to 10g/dl  X  Patient has easily accessible transportation to the hospital  X  Clear preference  X  Practitioner/physician deems patient reliable  X  Counseling by practitioner or physician  X  Patient education by RN  X  Consent form signed       Rho-Gam given by RN if indicated  X  Medication dispensed  _  Cytotec 800 mcg Intravaginally by patient at home       Intravaginally by NP in MAU       Rectally by patient at home  X   Rectally by RN in MAU  X  Ibuprofen 600 mg 1 tablet by mouth every 6 hours as needed #30 - prescribed  X   Tylenol #3 mg by mouth every 4 to 6 hours as needed - prescribed  X   Phenergan 12.5 mg by mouth every 4 hours as needed for nausea - prescribed  Reviewed with pt cytotec procedure.  Pt verbalizes that she lives close to the hospital and has transportation readily available.  Pt and family member appears reliable and verbalizes understanding and agrees with plan of care   Assessment and Plan  1. Incomplete miscarriage - Information provided on miscarriage - Return to MAU:  If you have heavier bleeding that soaks through more that 2 pads per hour for an hour or more  If you bleed so much that you feel like you might pass out or you do pass out  If you have significant abdominal pain that is not improved with Tylenol 1000 mg every 6 hours as needed for pain  If you develop a fever >  100.5 - Advised spouse to make sure to call 911, if she bleeds so much that she loses consciousness - he assures he is reliable enough to bring her if he observes any abnormal s/sx's  - Discharge patient - Message sent to [facility] to get patient scheduled for follow-up with provider in 2 weeks - Patient verbalized an understanding of the plan of care and agrees.    Raelyn Mora, MSN, CNM 12/01/2019, 2:44 PM

## 2019-12-01 NOTE — Progress Notes (Signed)
Patient was assessed and managed by nursing staff during this encounter. I have reviewed the chart and agree with the documentation and plan. I have also made any necessary editorial changes.  Warden Fillers, MD 12/01/2019 1:43 PM

## 2019-12-01 NOTE — Progress Notes (Signed)
Notified by Rosey Bath from MFM she brought patient down for [place] results which showed missed ab and patient actively bleeding.  Discussed with Dr.Bass and he recommended sending to MAU for evaluation.   I brought Cotter back to a room with [employer] and explained to her that US shows possible miscarriage and with her heavy bleeding that our doctor recommends she go to MAU at [facility] for evaluation. She voices understanding and states she will go there today as soon as she drops off her daughter. Isayah Ignasiak,RN

## 2019-12-01 NOTE — MAU Note (Signed)
.   Colleen Thornton is a 34 y.o. at [redacted]w[redacted]d here in MAU reporting: she was sent from office after U/S confirmed failed pregnancy Lower back and abdominal pain.Moderate amount of vaginal bleeding LMP:09/06/19 Onset of complaint:ongoing Pain score: abd 4/10 back 6/10 Vitals:   12/01/19 1304 12/01/19 1305  BP: (!) 143/72   Pulse: 65   Resp: 18   Temp: 98.9 F (37.2 C)   SpO2:  100%     FHT: Lab orders placed from triage: UA

## 2019-12-02 ENCOUNTER — Encounter: Payer: Self-pay | Admitting: Obstetrics and Gynecology

## 2019-12-10 ENCOUNTER — Other Ambulatory Visit: Payer: Self-pay

## 2019-12-10 ENCOUNTER — Ambulatory Visit (INDEPENDENT_AMBULATORY_CARE_PROVIDER_SITE_OTHER): Payer: Self-pay | Admitting: Obstetrics and Gynecology

## 2019-12-10 ENCOUNTER — Ambulatory Visit: Payer: Self-pay

## 2019-12-10 ENCOUNTER — Encounter: Payer: Self-pay | Admitting: Obstetrics and Gynecology

## 2019-12-10 VITALS — BP 129/71 | HR 51 | Ht 61.0 in | Wt 138.4 lb

## 2019-12-10 DIAGNOSIS — Z789 Other specified health status: Secondary | ICD-10-CM

## 2019-12-10 DIAGNOSIS — O039 Complete or unspecified spontaneous abortion without complication: Secondary | ICD-10-CM

## 2019-12-10 DIAGNOSIS — Z5189 Encounter for other specified aftercare: Secondary | ICD-10-CM

## 2019-12-10 DIAGNOSIS — O034 Incomplete spontaneous abortion without complication: Secondary | ICD-10-CM

## 2019-12-10 DIAGNOSIS — O09529 Supervision of elderly multigravida, unspecified trimester: Secondary | ICD-10-CM

## 2019-12-10 MED ORDER — MISOPROSTOL 200 MCG PO TABS
ORAL_TABLET | ORAL | 0 refills | Status: DC
Start: 2019-12-10 — End: 2019-12-15

## 2019-12-10 NOTE — Progress Notes (Signed)
Obstetrics and Gynecology Visit Return Patient Evaluation  Appointment Date: 12/10/2019  Primary Care Provider: Reece Leader  OBGYN Clinic: [facility]  Chief Complaint: follow up cytotec after miscarriage  History of Present Illness:  Kimmi Acocella is a 34 y.o.  [phone] with above CC. Patient went to MAU on 7/28 and dx with incomplete AB at 6/5 weeks based on MSD; no YS. Pt given cytotec there and told to follow up. Pt states she's had bleeding like a period yesterday and only spotting with wiping today but no heavy bleeding like what she was expecting. No pain, fevers, chills.   Review of Systems:  as noted in the History of Present Illness.   Patient Active Problem List   Diagnosis Date Noted  . Encounter for counseling regarding contraception 09/20/2019  . Encounter for initial prescription of contraceptive pills 03/30/2018  . Seasonal allergies 08/13/2015  . Dysuria 09/30/2012  . Carpal tunnel syndrome 08/10/2012  . Pregnancy 03/13/2012   Medications:  [name] had no medications administered during this visit. Current Outpatient Medications  Medication Sig Dispense Refill  . acetaminophen-codeine (TYLENOL #3) 300-30 MG tablet Take 1 tablet by mouth every 6 (six) hours as needed for moderate pain. 15 tablet 0  . ibuprofen (ADVIL) 600 MG tablet Take 1 tablet (600 mg total) by mouth every 6 (six) hours as needed. 60 tablet 1  . fluticasone (FLONASE) 50 MCG/ACT nasal spray Place 2 sprays into both nostrils daily. (Patient not taking: Reported on 10/28/2019) 18.2 mL 2  . Prenatal Vit-Fe Fumarate-FA (PRENATAL VITAMIN) 27-0.8 MG TABS Take 1 tablet by mouth daily. (Patient not taking: Reported on 12/10/2019) 90 tablet 3  . promethazine (PHENERGAN) 12.5 MG tablet Take 1 tablet (12.5 mg total) by mouth every 6 (six) hours as needed for nausea or vomiting. (Patient not taking: Reported on 12/10/2019) 30 tablet 0   No current  facility-administered medications for this visit.    Allergies: has No Known Allergies.  Physical Exam:  BP 129/71   Pulse (!) 51   Ht 5\' 1"  (1.549 m)   Wt 138 lb 6.4 oz (62.8 kg)   LMP 09/06/2019 (Approximate)   Breastfeeding Unknown Comment: SAB  BMI 26.15 kg/m  Body mass index is 26.15 kg/m. General appearance: Well nourished, well developed female in no acute distress.  Abdomen: diffusely non tender to palpation, non distended, and no masses, hernias Neuro/Psych:  Normal mood and affect.    Pelvic exam:  deferred  Radiology: TVUS done and GS seen. See images. No YS or fetal pole seen. No free fluid.  Assessment: pt stable  Plan:  1. Follow-up visit after miscarriage D/w her re: options, including repeat cytotec dose, in office MVA, OR D&C. Pt prefers repeat cytotec dose this weekend. ED precautions. I d/w her that if she has not had s/s c/w passing pregnancy to call the office on Monday to set up an MVA or OR D&C  Interpreter used  Sunday, [place] Bing MD Attending Center for [facility])

## 2019-12-10 NOTE — H&P (View-Only) (Signed)
Obstetrics and Gynecology Visit Return Patient Evaluation  Appointment Date: 12/10/2019  Primary Care Provider: Ganta, Anupa  OBGYN Clinic: [facility]  Chief Complaint: follow up cytotec after miscarriage  History of Present Illness:  Colleen Thornton is a 34 y.o.  [id number] with above CC. Patient went to [facility] on 7/28 and dx with incomplete AB at 6/5 weeks based on MSD; no YS. Pt given cytotec there and told to follow up. Pt states she's had bleeding like a period yesterday and only spotting with wiping today but no heavy bleeding like what she was expecting. No pain, fevers, chills.   Review of Systems:  as noted in the History of Present Illness.   Patient Active Problem List   Diagnosis Date Noted  . Encounter for counseling regarding contraception 09/20/2019  . Encounter for initial prescription of contraceptive pills 03/30/2018  . Seasonal allergies 08/13/2015  . Dysuria 09/30/2012  . Carpal tunnel syndrome 08/10/2012  . Pregnancy 03/13/2012   Medications:  Sharone Thornton had no medications administered during this visit. Current Outpatient Medications  Medication Sig Dispense Refill  . acetaminophen-codeine (TYLENOL #3) 300-30 MG tablet Take 1 tablet by mouth every 6 (six) hours as needed for moderate pain. 15 tablet 0  . ibuprofen (ADVIL) 600 MG tablet Take 1 tablet (600 mg total) by mouth every 6 (six) hours as needed. 60 tablet 1  . fluticasone (FLONASE) 50 MCG/ACT nasal spray Place 2 sprays into both nostrils daily. (Patient not taking: Reported on 10/28/2019) 18.2 mL 2  . Prenatal Vit-Fe Fumarate-FA (PRENATAL VITAMIN) 27-0.8 MG TABS Take 1 tablet by mouth daily. (Patient not taking: Reported on 12/10/2019) 90 tablet 3  . promethazine (PHENERGAN) 12.5 MG tablet Take 1 tablet (12.5 mg total) by mouth every 6 (six) hours as needed for nausea or vomiting. (Patient not taking: Reported on 12/10/2019) 30 tablet 0   No current  facility-administered medications for this visit.    Allergies: has No Known Allergies.  Physical Exam:  BP 129/71   Pulse (!) 51   Ht 5' 1" (1.549 m)   Wt 138 lb 6.4 oz (62.8 kg)   LMP 09/06/2019 (Approximate)   Breastfeeding Unknown Comment: SAB  BMI 26.15 kg/m  Body mass index is 26.15 kg/m. General appearance: Well nourished, well developed female in no acute distress.  Abdomen: diffusely non tender to palpation, non distended, and no masses, hernias Neuro/Psych:  Normal mood and affect.    Pelvic exam:  deferred  Radiology: TVUS done and GS seen. See images. No YS or fetal pole seen. No free fluid.  Assessment: pt stable  Plan:  1. Follow-up visit after miscarriage D/w her re: options, including repeat cytotec dose, in office MVA, OR D&C. Pt prefers repeat cytotec dose this weekend. ED precautions. I d/w her that if she has not had s/s c/w passing pregnancy to call the office on Monday to set up an MVA or OR D&C  Interpreter used  Fount Bahe, Jr MD Attending [facility] (Faculty Practice)   

## 2019-12-10 NOTE — Patient Instructions (Addendum)
Aborto espontneo Miscarriage El aborto espontneo es la prdida de un beb que no ha nacido (feto) antes de la semana20 del embarazo. Siga estas indicaciones en su casa: Medicamentos   Tome los medicamentos de venta libre y los recetados solamente como se lo haya indicado el mdico.  Si le recetaron un antibitico, tmelo como se lo haya indicado el mdico. No deje de tomar los antibiticos aunque comience a sentirse mejor.  No tome antiinflamatorios no esteroideos (AINE), a menos que el mdico le diga que son seguros para usted. Estos incluyen aspirina e ibuprofeno. Estos medicamentos pueden provocarle sangrado. Actividad  Haga reposo segn lo indicado. Pregntele al mdico qu actividades son seguras para usted.  Pida ayuda para realizar las tareas de la casa durante este tiempo. Instrucciones generales  Anote cuntos apsitos usa por da y cun saturados estn.  Observe la cantidad de tejido o grumos de sangre (cogulos de sangre) que expulsa por la vagina. Guarde las cantidades grandes de tejido para llevrselas al mdico.  No use tampones, no se haga duchas vaginales ni tenga relaciones sexuales hasta que el mdico la autorice.  Para que usted y su pareja puedan sobrellevar el proceso de duelo, hable con su mdico o busque apoyo psicolgico.  Cuando est lista, acuda al mdico para hablar sobre los pasos que debe seguir para cuidar su salud. Adems, hable con su mdico sobre las medidas que debe adoptar para tener un embarazo saludable en el futuro.  Concurra a todas las visitas de seguimiento como se lo haya indicado el mdico. Esto es importante. Comunquese con un mdico si:  Tiene fiebre o siente escalofros.  Tiene una secrecin vaginal con mal olor.  Aumenta el sangrado. Solicite ayuda de inmediato si:  Tiene espasmos o dolor muy intensos en el abdomen o en la espalda.  Elimina grumos de sangre por la vagina, que tienen el tamao de una nuez o ms.  Elimina  tejido por la vagina, que tiene el tamao de una nuez o ms.  Empapa ms de un apsito de tamao normal por hora.  Se siente dbil o mareada.  Pierde el conocimiento (se desmaya).  Siente tristeza que no se va o piensa en lastimarse. Resumen  El aborto espontneo es la prdida de un beb que no ha nacido antes de la semana20 del embarazo.  Siga las indicaciones de su mdico para el cuidado en su hogar. Concurra a todas las visitas de control.  Para que usted y su pareja puedan sobrellevar el proceso de duelo, hable con su mdico o busque apoyo psicolgico. Esta informacin no tiene como fin reemplazar el consejo del mdico. Asegrese de hacerle al mdico cualquier pregunta que tenga. Document Revised: 01/27/2017 Document Reviewed: 01/27/2017 Elsevier Patient Education  2020 Elsevier Inc.  

## 2019-12-13 ENCOUNTER — Telehealth: Payer: Self-pay | Admitting: Obstetrics and Gynecology

## 2019-12-13 NOTE — Telephone Encounter (Signed)
GYN Telephone Note Unfortunately, the offices that do MVAs are full this week, so I d/w her that I recommend going to the OR for this procedure, which she is amenable to. In basket request sent to set up suction d&c with any available provider for this week. ED precautions given. All questions asked and answered.   Interpreter used  Van Horne Bing, Montez Hageman MD Attending [facility] [employer] (Faculty Practice) 12/13/2019 Time: [phone]

## 2019-12-14 ENCOUNTER — Encounter (HOSPITAL_BASED_OUTPATIENT_CLINIC_OR_DEPARTMENT_OTHER): Payer: Self-pay | Admitting: Obstetrics & Gynecology

## 2019-12-14 ENCOUNTER — Other Ambulatory Visit (HOSPITAL_COMMUNITY)
Admission: RE | Admit: 2019-12-14 | Discharge: 2019-12-14 | Disposition: A | Discharge status: Home / Self Care | Payer: HRSA Program | Source: Ambulatory Visit | Attending: Obstetrics & Gynecology | Admitting: Obstetrics & Gynecology

## 2019-12-14 ENCOUNTER — Other Ambulatory Visit: Payer: Self-pay

## 2019-12-14 DIAGNOSIS — Z20822 Contact with and (suspected) exposure to covid-19: Secondary | ICD-10-CM | POA: Insufficient documentation

## 2019-12-14 DIAGNOSIS — Z01812 Encounter for preprocedural laboratory examination: Secondary | ICD-10-CM | POA: Insufficient documentation

## 2019-12-14 LAB — SARS CORONAVIRUS 2 (TAT 6-24 HRS): SARS Coronavirus 2: NEGATIVE

## 2019-12-15 ENCOUNTER — Encounter (HOSPITAL_BASED_OUTPATIENT_CLINIC_OR_DEPARTMENT_OTHER)
Admission: RE | Disposition: A | Discharge status: Home / Self Care | Payer: Self-pay | Source: Home / Self Care | Attending: Obstetrics & Gynecology

## 2019-12-15 ENCOUNTER — Ambulatory Visit (HOSPITAL_BASED_OUTPATIENT_CLINIC_OR_DEPARTMENT_OTHER): Payer: Self-pay | Admitting: Anesthesiology

## 2019-12-15 ENCOUNTER — Encounter (HOSPITAL_BASED_OUTPATIENT_CLINIC_OR_DEPARTMENT_OTHER): Payer: Self-pay | Admitting: Obstetrics & Gynecology

## 2019-12-15 ENCOUNTER — Other Ambulatory Visit: Payer: Self-pay

## 2019-12-15 ENCOUNTER — Ambulatory Visit (HOSPITAL_BASED_OUTPATIENT_CLINIC_OR_DEPARTMENT_OTHER)
Admission: RE | Admit: 2019-12-15 | Discharge: 2019-12-15 | Disposition: A | Discharge status: Home / Self Care | Payer: Self-pay | Attending: Obstetrics & Gynecology | Admitting: Obstetrics & Gynecology

## 2019-12-15 DIAGNOSIS — O021 Missed abortion: Secondary | ICD-10-CM

## 2019-12-15 DIAGNOSIS — Z3A01 Less than 8 weeks gestation of pregnancy: Secondary | ICD-10-CM | POA: Insufficient documentation

## 2019-12-15 DIAGNOSIS — Z79899 Other long term (current) drug therapy: Secondary | ICD-10-CM | POA: Insufficient documentation

## 2019-12-15 DIAGNOSIS — O034 Incomplete spontaneous abortion without complication: Secondary | ICD-10-CM | POA: Insufficient documentation

## 2019-12-15 DIAGNOSIS — Z791 Long term (current) use of non-steroidal anti-inflammatories (NSAID): Secondary | ICD-10-CM | POA: Insufficient documentation

## 2019-12-15 DIAGNOSIS — O09521 Supervision of elderly multigravida, first trimester: Secondary | ICD-10-CM | POA: Insufficient documentation

## 2019-12-15 HISTORY — PX: DILATION AND EVACUATION: SHX1459

## 2019-12-15 SURGERY — DILATION AND EVACUATION, UTERUS
Anesthesia: General | Site: Uterus

## 2019-12-15 MED ORDER — ONDANSETRON HCL 4 MG/2ML IJ SOLN
INTRAMUSCULAR | Status: DC | PRN
  Administered 2019-12-15: 4 mg via INTRAVENOUS

## 2019-12-15 MED ORDER — FENTANYL CITRATE (PF) 100 MCG/2ML IJ SOLN
INTRAMUSCULAR | Status: AC
  Filled 2019-12-15: qty 2

## 2019-12-15 MED ORDER — LACTATED RINGERS IV SOLN: INTRAVENOUS | Status: DC

## 2019-12-15 MED ORDER — LIDOCAINE HCL 1 % IJ SOLN
INTRAMUSCULAR | Status: DC | PRN
  Administered 2019-12-15: 50 mg via INTRADERMAL

## 2019-12-15 MED ORDER — BUPIVACAINE HCL (PF) 0.25 % IJ SOLN
INTRAMUSCULAR | Status: DC | PRN
  Administered 2019-12-15: 8 mL

## 2019-12-15 MED ORDER — MIDAZOLAM HCL 2 MG/2ML IJ SOLN
INTRAMUSCULAR | Status: AC
  Filled 2019-12-15: qty 2

## 2019-12-15 MED ORDER — DOXYCYCLINE HYCLATE 100 MG IV SOLR
200.0000 mg | INTRAVENOUS | Status: AC
  Administered 2019-12-15: 200 mg via INTRAVENOUS
  Filled 2019-12-15: qty 200

## 2019-12-15 MED ORDER — FENTANYL CITRATE (PF) 100 MCG/2ML IJ SOLN
INTRAMUSCULAR | Status: DC | PRN
  Administered 2019-12-15: 50 ug via INTRAVENOUS

## 2019-12-15 MED ORDER — PROPOFOL 10 MG/ML IV BOLUS
INTRAVENOUS | Status: DC | PRN
  Administered 2019-12-15: 200 mg via INTRAVENOUS

## 2019-12-15 MED ORDER — OXYCODONE HCL 5 MG PO TABS: 5.0000 mg | ORAL_TABLET | Freq: Once | ORAL | Status: DC | PRN

## 2019-12-15 MED ORDER — POVIDONE-IODINE 10 % EX SWAB
2.0000 "application " | Freq: Once | CUTANEOUS | Status: AC
  Administered 2019-12-15: 2 via TOPICAL

## 2019-12-15 MED ORDER — OXYCODONE-ACETAMINOPHEN 5-325 MG PO TABS: 1.0000 | ORAL_TABLET | Freq: Four times a day (QID) | ORAL | 0 refills | Status: DC | PRN

## 2019-12-15 MED ORDER — MIDAZOLAM HCL 5 MG/5ML IJ SOLN
INTRAMUSCULAR | Status: DC | PRN
  Administered 2019-12-15: 2 mg via INTRAVENOUS

## 2019-12-15 MED ORDER — CARBOPROST TROMETHAMINE 250 MCG/ML IM SOLN
INTRAMUSCULAR | Status: AC
  Filled 2019-12-15: qty 1

## 2019-12-15 MED ORDER — PROPOFOL 10 MG/ML IV BOLUS
INTRAVENOUS | Status: AC
  Filled 2019-12-15: qty 20

## 2019-12-15 MED ORDER — ONDANSETRON HCL 4 MG/2ML IJ SOLN
INTRAMUSCULAR | Status: AC
  Filled 2019-12-15: qty 2

## 2019-12-15 MED ORDER — OXYCODONE HCL 5 MG/5ML PO SOLN: 5.0000 mg | Freq: Once | ORAL | Status: DC | PRN

## 2019-12-15 MED ORDER — METHYLERGONOVINE MALEATE 0.2 MG/ML IJ SOLN
INTRAMUSCULAR | Status: AC
  Filled 2019-12-15: qty 1

## 2019-12-15 MED ORDER — ONDANSETRON HCL 4 MG/2ML IJ SOLN: 4.0000 mg | Freq: Four times a day (QID) | INTRAMUSCULAR | Status: DC | PRN

## 2019-12-15 MED ORDER — FENTANYL CITRATE (PF) 100 MCG/2ML IJ SOLN: 25.0000 ug | INTRAMUSCULAR | Status: DC | PRN

## 2019-12-15 MED ORDER — SODIUM CHLORIDE 0.9 % IV SOLN
INTRAVENOUS | Status: AC
  Filled 2019-12-15 (×2): qty 100

## 2019-12-15 MED ORDER — DEXAMETHASONE SODIUM PHOSPHATE 10 MG/ML IJ SOLN
INTRAMUSCULAR | Status: DC | PRN
  Administered 2019-12-15: 10 mg via INTRAVENOUS

## 2019-12-15 SURGICAL SUPPLY — 25 items
CATH ROBINSON RED A/P 14FR (CATHETERS) ×3 IMPLANT
DECANTER SPIKE VIAL GLASS SM (MISCELLANEOUS) IMPLANT
FILTER UTR ASPR ASSEMBLY (MISCELLANEOUS) IMPLANT
GAUZE 4X4 16PLY RFD (DISPOSABLE) ×3 IMPLANT
GLOVE BIO SURGEON STRL SZ 6.5 (GLOVE) ×2 IMPLANT
GLOVE BIO SURGEONS STRL SZ 6.5 (GLOVE) ×1
GLOVE BIOGEL PI IND STRL 7.0 (GLOVE) ×2 IMPLANT
GLOVE BIOGEL PI INDICATOR 7.0 (GLOVE) ×4
GOWN STRL REUS W/ TWL LRG LVL3 (GOWN DISPOSABLE) ×1 IMPLANT
GOWN STRL REUS W/TWL LRG LVL3 (GOWN DISPOSABLE) ×7 IMPLANT
HIBICLENS CHG 4% 4OZ BTL (MISCELLANEOUS) IMPLANT
HOSE CONNECTING 18IN BERKELEY (TUBING) ×2 IMPLANT
KIT BERKELEY 1ST TRI 3/8 NO TR (MISCELLANEOUS) ×3 IMPLANT
KIT BERKELEY 1ST TRIMESTER 3/8 (MISCELLANEOUS) ×3 IMPLANT
NS IRRIG 1000ML POUR BTL (IV SOLUTION) ×3 IMPLANT
PACK VAGINAL MINOR WOMEN LF (CUSTOM PROCEDURE TRAY) ×3 IMPLANT
PAD OB MATERNITY 4.3X12.25 (PERSONAL CARE ITEMS) ×3 IMPLANT
PAD PREP 24X48 CUFFED NSTRL (MISCELLANEOUS) ×3 IMPLANT
SET BERKELEY SUCTION TUBING (SUCTIONS) ×3 IMPLANT
SLEEVE SCD COMPRESS KNEE MED (MISCELLANEOUS) ×3 IMPLANT
TOWEL GREEN STERILE FF (TOWEL DISPOSABLE) ×3 IMPLANT
VACURETTE 10 RIGID CVD (CANNULA) IMPLANT
VACURETTE 7MM CVD STRL WRAP (CANNULA) IMPLANT
VACURETTE 8 RIGID CVD (CANNULA) ×2 IMPLANT
VACURETTE 9 RIGID CVD (CANNULA) IMPLANT

## 2019-12-15 NOTE — Discharge Instructions (Signed)
Aborto espontneo Miscarriage El aborto espontneo es la prdida de un beb que no ha nacido (feto) antes de la semana20 del Psychiatrist. Siga estas indicaciones en su casa: Medicamentos   [employer] de venta libre y los recetados solamente como se lo haya indicado el mdico.  Si le recetaron un antibitico, tmelo como se lo haya indicado el mdico. No deje de tomar los antibiticos aunque comience a Actor.  No tome antiinflamatorios no esteroideos (AINE), a menos que el [employer] diga que son seguros para usted. Estos incluyen aspirina e ibuprofeno. Estos medicamentos pueden provocarle sangrado. Actividad  Haga reposo segn lo indicado. Pregntele al mdico qu actividades son seguras para usted.  Pida ayuda para Education officer, environmental las tareas de la casa durante [place]. Instrucciones generales  Anote cuntos apsitos [place] por da y cun saturados estn.  Observe la cantidad de tejido o grumos de sangre (cogulos de sangre) que expulsa por la vagina. Guarde las cantidades grandes de tejido para llevrselas al mdico.  No use tampones, no se haga duchas vaginales ni tenga relaciones sexuales hasta que el mdico la autorice.  Para que usted y su pareja puedan sobrellevar el proceso de duelo, hable con su mdico o busque apoyo psicolgico.  Cuando est lista, acuda al mdico para hablar [employer] pasos que debe seguir para cuidar su salud. Adems, hable con su mdico [employer] que debe adoptar para tener un embarazo saludable en el futuro.  Concurra a todas las visitas de [address] se lo haya indicado el mdico. Esto es importante. Comunquese con un mdico si:  Tiene fiebre o siente escalofros.  Tiene una secrecin vaginal con mal olor.  Aumenta el sangrado. Solicite ayuda de inmediato si:  Tiene espasmos o dolor muy intensos en el abdomen o en la espalda.  Elimina grumos de sangre por la vagina, que tienen el tamao de una nuez o ms.  Elimina  tejido por la vagina, que tiene el tamao de una nuez o ms.  Empapa ms de un apsito de tamao normal por hora.  Se siente dbil o mareada.  Pierde el conocimiento (se desmaya).  Siente tristeza que no se va o piensa en lastimarse. Resumen  El aborto espontneo es la prdida de un beb que no ha nacido antes de la semana20 del [place].  Siga las indicaciones de su mdico para el cuidado Engineer, building services. Concurra a todas las visitas de control.  Para que usted y su pareja puedan sobrellevar el proceso de duelo, hable con su mdico o busque apoyo psicolgico. Esta informacin no tiene Theme park manager el consejo del mdico. Asegrese de hacerle al mdico cualquier pregunta que tenga. Document Revised: 01/27/2017 Document Reviewed: 01/27/2017 Elsevier Patient Education  2020 [employer].   Post Anesthesia Home Care Instructions  Activity: Get plenty of rest for the remainder of the day. A responsible individual must stay with you for 24 hours following the procedure.  For the next 24 hours, DO NOT: -Drive a car -Advertising copywriter -Drink alcoholic beverages -Take any medication unless instructed by your physician -Make any legal decisions or sign important papers.  Meals: Start with liquid foods such as gelatin or soup. Progress to regular foods as tolerated. Avoid greasy, spicy, heavy foods. If nausea and/or vomiting occur, drink only clear liquids until the nausea and/or vomiting subsides. Call your physician if vomiting continues.  Special Instructions/Symptoms: Your throat may feel dry or sore from the anesthesia or the breathing tube placed in your  throat during surgery. If this causes discomfort, gargle with warm salt water. The discomfort should disappear within 24 hours.  If you had a scopolamine patch placed behind your ear for the management of post- operative nausea and/or vomiting:  1. The medication in the patch is effective for 72 hours, after which it should be  removed.  Wrap patch in a tissue and discard in the trash. Wash hands thoroughly with soap and water. 2. You may remove the patch earlier than 72 hours if you experience unpleasant side effects which may include dry mouth, dizziness or visual disturbances. 3. Avoid touching the patch. Wash your hands with soap and water after contact with the patch.

## 2019-12-15 NOTE — Anesthesia Procedure Notes (Signed)
Procedure Name: LMA Insertion Date/Time: 12/15/2019 11:38 AM Performed by: Thornell Mule, CRNA Pre-anesthesia Checklist: Patient identified, Emergency Drugs available, Suction available and Patient being monitored Patient Re-evaluated:Patient Re-evaluated prior to induction Oxygen Delivery Method: Circle system utilized Preoxygenation: Pre-oxygenation with 100% oxygen Induction Type: IV induction Ventilation: Mask ventilation without difficulty LMA: LMA inserted LMA Size: 4.0 Number of attempts: 1 Placement Confirmation: positive ETCO2 Tube secured with: Tape Dental Injury: Teeth and Oropharynx as per pre-operative assessment

## 2019-12-15 NOTE — Transfer of Care (Signed)
Immediate Anesthesia Transfer of Care Note  Patient: Colleen Thornton  Procedure(s) Performed: DILATATION AND EVACUATION (N/A Uterus)  Patient Location: PACU  Anesthesia Type:General  Level of Consciousness: drowsy, patient cooperative and responds to stimulation  Airway & Oxygen Therapy: Patient Spontanous Breathing and Patient connected to face mask oxygen  Post-op Assessment: Report given to RN and Post -op Vital signs reviewed and stable  Post vital signs: Reviewed and stable  Last Vitals:  Vitals Value Taken Time  BP    Temp    Pulse 55 12/15/19 1204  Resp 16 12/15/19 1204  SpO2 100 % 12/15/19 1204  Vitals shown include unvalidated device data.  Last Pain:  Vitals:   12/15/19 1039  TempSrc: Oral  PainSc: 0-No pain      Patients Stated Pain Goal: 3 (12/15/19 1039)  Complications: No complications documented.

## 2019-12-15 NOTE — Anesthesia Preprocedure Evaluation (Addendum)
Anesthesia Evaluation  Patient identified by MRN, date of birth, ID band Patient awake    Reviewed: Allergy & Precautions, H&P , NPO status , Patient's Chart, lab work & pertinent test results  Airway Mallampati: II   Neck ROM: full    Dental   Pulmonary neg pulmonary ROS,    breath sounds clear to auscultation       Cardiovascular negative cardio ROS   Rhythm:regular Rate:Normal     Neuro/Psych    GI/Hepatic   Endo/Other  diabetes, Gestational  Renal/GU      Musculoskeletal   Abdominal   Peds  Hematology   Anesthesia Other Findings   Reproductive/Obstetrics                             Anesthesia Physical Anesthesia Plan  ASA: II  Anesthesia Plan: General   Post-op Pain Management:    Induction: Intravenous  PONV Risk Score and Plan: 3 and Ondansetron, Dexamethasone, Midazolam and Treatment may vary due to age or medical condition  Airway Management Planned: LMA  Additional Equipment:   Intra-op Plan:   Post-operative Plan: Extubation in OR  Informed Consent: I have reviewed the patients History and Physical, chart, labs and discussed the procedure including the risks, benefits and alternatives for the proposed anesthesia with the patient or authorized representative who has indicated his/her understanding and acceptance.       Plan Discussed with: CRNA, Anesthesiologist and Surgeon  Anesthesia Plan Comments:         Anesthesia Quick Evaluation

## 2019-12-15 NOTE — Interval H&P Note (Signed)
History and Physical Interval Note:  12/15/2019 11:27 AM  Colleen Thornton  has presented today for surgery, with the diagnosis of Incomplete AB.  The various methods of treatment have been discussed with the patient and family. After consideration of risks, benefits and other options for treatment, the patient has consented to  Procedure(s): DILATATION AND EVACUATION (N/A) as a surgical intervention.  The patient's history has been reviewed, patient examined, no change in status, stable for surgery.  I have reviewed the patient's chart and labs.  Questions were answered to the patient's satisfaction.   The risks of surgery were discussed in detail with the patient including but not limited to: bleeding which may require transfusion or reoperation; infection which may require prolonged hospitalization or re-hospitalization and antibiotic therapy; injury to bowel, bladder, ureters and major vessels or other surrounding organs; formation of adhesions; need for additional procedures including laparotomy; thromboembolic phenomenon; incisional problems and other postoperative or anesthesia complications.  Patient was told that the likelihood that her condition and symptoms will be treated effectively with this surgical management was very high; the postoperative expectations were also discussed in detail. The patient also understands the alternative treatment options which were discussed in full. All questions were answered.     Scheryl Darter

## 2019-12-15 NOTE — Op Note (Signed)
Colleen Thornton PROCEDURE DATE: 12/15/2019  PREOPERATIVE DIAGNOSIS: 7 week missed abortion POSTOPERATIVE DIAGNOSIS: The same PROCEDURE:     Dilation and Evacuation SURGEON:  Scheryl Darter MD  INDICATIONS: 34 y.o. [phone] with MAB at [redacted] weeks gestation, needing surgical completion.  Risks of surgery were discussed with the patient including but not limited to: bleeding which may require transfusion; infection which may require antibiotics; injury to uterus or surrounding organs; need for additional procedures including laparotomy or laparoscopy; possibility of intrauterine scarring which may impair future fertility; and other postoperative/anesthesia complications. Written informed consent was obtained.    FINDINGS:  A 8 week size uterus, moderate amounts of products of conception, specimen sent to pathology.  ANESTHESIA:    Monitored intravenous sedation, paracervical block. INTRAVENOUS FLUIDS:  500 ml of LR ESTIMATED BLOOD LOSS:   25 ml. SPECIMENS:  Products of conception sent to pathology COMPLICATIONS:  None immediate.  PROCEDURE DETAILS:  The patient received intravenous Doxycycline while in the preoperative area.  She was then taken to the operating room where monitored intravenous sedation was administered and was found to be adequate.  After an adequate timeout was performed, she was placed in the dorsal lithotomy position and examined; then prepped and draped in the sterile manner.   Her bladder was catheterized for an unmeasured amount of clear, yellow urine. A vaginal speculum was then placed in the patient's vagina and a single tooth tenaculum was applied to the anterior lip of the cervix.  A paracervical block using 100 ml of 0.5% Marcaine was administered. The cervix was gently dilated to accommodate a 8 mm suction curette that was gently advanced to the uterine fundus.  The suction device was then activated and curette slowly rotated to clear the uterus of products of  conception. . There was minimal bleeding noted and the tenaculum removed with good hemostasis noted.   All instruments were removed from the patient's vagina.  Sponge and instrument counts were correct times two  The patient tolerated the procedure well and was taken to the recovery area awake, and in stable condition.  Adam Phenix, MD 12/15/2019 12:23 PM

## 2019-12-16 ENCOUNTER — Ambulatory Visit: Payer: Self-pay | Admitting: Family Medicine

## 2019-12-16 ENCOUNTER — Encounter (HOSPITAL_BASED_OUTPATIENT_CLINIC_OR_DEPARTMENT_OTHER): Payer: Self-pay | Admitting: Obstetrics & Gynecology

## 2019-12-16 LAB — SURGICAL PATHOLOGY

## 2019-12-16 NOTE — Anesthesia Postprocedure Evaluation (Signed)
Anesthesia Post Note  Patient: Colleen Thornton  Procedure(s) Performed: DILATATION AND EVACUATION (N/A Uterus)     Patient location during evaluation: PACU Anesthesia Type: General Level of consciousness: awake and alert Pain management: pain level controlled Vital Signs Assessment: post-procedure vital signs reviewed and stable Respiratory status: spontaneous breathing, nonlabored ventilation, respiratory function stable and patient connected to nasal cannula oxygen Cardiovascular status: blood pressure returned to baseline and stable Postop Assessment: no apparent nausea or vomiting Anesthetic complications: no   No complications documented.  Last Vitals:  Vitals:   12/15/19 1230 12/15/19 1342  BP: 109/61 125/67  Pulse: (!) 45 90  Resp: 11 14  Temp:  36.7 C  SpO2: 100% 100%    Last Pain:  Vitals:   12/16/19 1033  TempSrc:   PainSc: 3                  Lidia Clavijo S

## 2019-12-16 NOTE — Progress Notes (Signed)
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

## 2019-12-27 ENCOUNTER — Encounter: Payer: Self-pay | Admitting: Certified Nurse Midwife

## 2019-12-27 ENCOUNTER — Other Ambulatory Visit: Payer: Self-pay

## 2019-12-27 ENCOUNTER — Ambulatory Visit (INDEPENDENT_AMBULATORY_CARE_PROVIDER_SITE_OTHER): Payer: Self-pay | Admitting: Certified Nurse Midwife

## 2019-12-27 VITALS — BP 124/71 | HR 55 | Ht 61.0 in | Wt 138.4 lb

## 2019-12-27 DIAGNOSIS — O020 Blighted ovum and nonhydatidiform mole: Secondary | ICD-10-CM

## 2019-12-27 DIAGNOSIS — O039 Complete or unspecified spontaneous abortion without complication: Secondary | ICD-10-CM

## 2019-12-27 NOTE — Patient Instructions (Signed)
Molar Pregnancy  A molar pregnancy (hydatidiform mole) is a mass of tissue that grows in the uterus after an egg is fertilized incorrectly. The mass does not develop into a fetus, and is considered an abnormal pregnancy. Usually, the pregnancy ends on its own through miscarriage. In some cases, treatment may be required. What are the causes? This condition is caused by an egg that is fertilized incorrectly so that it has abnormal genetic material (chromosomes). This can result in one of two types of molar pregnancies:  Complete molar pregnancy. This is when all of the chromosomes in the fertilized egg are from the father, and none are from the mother.  Partial molar pregnancy. This is when the fertilized egg has chromosomes from the father and mother, but it has too many chromosomes. What increases the risk? This condition is more likely to develop in:  Women who are over the age of 35 or under the age of 20.  Women who have had a molar pregnancy in the past (very rare). Other possible risk factors include:  Smoking more than 15 cigarettes a day.  History of infertility.  Having a blood type A, B, or AB.  Having a lack (deficiency) of vitamin A.  Using birth control pills (oral contraceptives). What are the signs or symptoms? Symptoms of this condition include:  Vaginal bleeding.  Missed menstrual period.  The uterus growing faster than expected for a normal pregnancy.  Severe nausea and vomiting.  Severe pressure or pain in the uterus.  Abnormal ovarian cysts (theca lutein cysts).  Vaginal discharge that looks like grapes.  High blood pressure (early onset of preeclampsia).  Overactive thyroid gland (hyperthyroidism).  Not having enough red blood cells or hemoglobin (anemia). How is this diagnosed? This condition is diagnosed based on ultrasound and blood tests. How is this treated? Usually, molar pregnancies end on their own by miscarriage. A health care provider  may manage this condition by:  Monitoring the levels of pregnancy hormones in your blood to make sure that the hormone levels are decreasing as expected.  Giving you a medicine called Rho (D) immune globulin. This medicine helps to prevent problems that may occur in future pregnancies as a result of a protein on red blood cells (Rh factor). You may be given this medicine if you do not have an Rh factor (you are Rh negative) and your sex partner has an Rh factor (he is Rh positive).  Putting you on chemotherapy. This involves taking medicines that regulate levels of pregnancy hormones. This may be done if your pregnancy hormone levels are not decreasing as expected.  Performing a procedure called dilation and curettage (D&C), or vacuum curettage. These are minor procedures that involve scraping or suctioning the molar pregnancy out of the uterus and removing it through the vagina. Even if a molar pregnancy ends on its own, one of these procedures may be done to make sure that all the abnormal tissue is out of the uterus.  Doing a surgical removal of the uterus (hysterectomy). Follow these instructions at home:  Avoid getting pregnant for 6-12 months, or as long as told by your health care provider. To avoid getting pregnant, avoid having sex or use a reliable form of birth control every time you have sex.  Take over-the-counter and prescription medicines only as told by your health care provider.  Rest as needed, and slowly return to your normal activities.  Think about joining a support group. If you are struggling with grief, ask   your health care provider for help.  Keep all follow-up visits as told by your health care provider. This is important. You may need follow-up blood tests or ultrasounds. Contact a health care provider if:  You continue to have irregular vaginal bleeding.  You have abdominal pain. Summary  A molar pregnancy (hydatidiform mole) is a mass of tissue that grows in  the uterus after an egg is fertilized incorrectly.  This condition is more likely to develop in women who are over the age of 35 or under the age of 20 or women who have had a molar pregnancy in the past.  The most common symptom of this condition is vaginal bleeding.  Usually, molar pregnancy ends with a miscarriage, and no treatment is needed. This information is not intended to replace advice given to you by your health care provider. Make sure you discuss any questions you have with your health care provider. Document Revised: 04/04/2017 Document Reviewed: 06/26/2016 Elsevier Patient Education  2020 Elsevier Inc.  

## 2019-12-27 NOTE — Progress Notes (Signed)
  History:  [name] is a 34 y.o. [phone] who presents to clinic today for repeat HCG and discussion of her molar pregnancy which was resolved on 12/15/19. Does not understand what kind of pregnancy she had, says she was told "something was wrong with the chromosomes so the pregnancy was no good" and that she needed a D&C.  The following portions of the patient's history were reviewed and updated as appropriate: allergies, current medications, family history, past medical history, social history, past surgical history and problem list.  Review of Systems:  Review of Systems  Musculoskeletal: Positive for back pain (mild low back pain ).  Psychiatric/Behavioral:       Has been feeling some mood disturbances (mainly sad and a little anxious) since the procedure, but says these are starting to get better - just over the past two days.  All other systems reviewed and are negative.     Objective:  Physical Exam BP 124/71   Pulse (!) 55   Ht 5\' 1"  (1.549 m)   Wt 138 lb 6.4 oz (62.8 kg)   LMP 09/06/2019 (Approximate)   BMI 26.15 kg/m  Physical Exam Constitutional:      General: She is not in acute distress.    Appearance: Normal appearance. She is normal weight.  Cardiovascular:     Rate and Rhythm: Normal rate.     Pulses: Normal pulses.  Pulmonary:     Effort: Pulmonary effort is normal.  Neurological:     Mental Status: She is alert and oriented to person, place, and time.  Psychiatric:        Mood and Affect: Mood normal.        Behavior: Behavior normal.        Thought Content: Thought content normal.        Judgment: Judgment normal.    Assessment & Plan:  1. SAB (spontaneous abortion) - Beta hCG quant (ref lab); Future - Beta hCG quant (ref lab)  2. Molar pregnancy - Explained thoroughly what a molar pregnancy is, what it is not, and the chances of it happening again (through the in-person interpreter - 11/06/2019). Pt verbalized  understanding.  Approximately 15 minutes of face-to-face time was spent with this patient   Colleen Thornton 12/27/2019 3:21 PM

## 2019-12-27 NOTE — Progress Notes (Signed)
Pt states is having light bleeding. At work she felt a little dizzy.

## 2019-12-28 ENCOUNTER — Telehealth: Payer: Self-pay

## 2019-12-28 LAB — BETA HCG QUANT (REF LAB): hCG Quant: 5 m[IU]/mL

## 2019-12-28 NOTE — Telephone Encounter (Signed)
Patient returns completed application to nurse clinic for Mirena IUD. Patient provided supporting documentation and is attached to application. Placed in provider box for provider required documentation. I have also attached the english copy of application for reference.   Please return to RN team once completed for processing.   To PCP  Veronda Prude, RN

## 2019-12-30 ENCOUNTER — Other Ambulatory Visit: Payer: Self-pay | Admitting: Certified Nurse Midwife

## 2019-12-30 DIAGNOSIS — O020 Blighted ovum and nonhydatidiform mole: Secondary | ICD-10-CM

## 2019-12-30 NOTE — Progress Notes (Signed)
Needs repeat quant a week after last one (12/27/19).

## 2019-12-31 NOTE — Telephone Encounter (Signed)
I have placed the completed document in the RN folder.

## 2020-01-06 ENCOUNTER — Other Ambulatory Visit: Payer: Self-pay

## 2020-01-06 DIAGNOSIS — O020 Blighted ovum and nonhydatidiform mole: Secondary | ICD-10-CM

## 2020-01-07 LAB — BETA HCG QUANT (REF LAB): hCG Quant: 1 m[IU]/mL

## 2020-01-12 ENCOUNTER — Ambulatory Visit: Payer: Self-pay | Admitting: Obstetrics & Gynecology

## 2020-01-17 ENCOUNTER — Other Ambulatory Visit: Payer: Self-pay

## 2020-01-17 ENCOUNTER — Encounter: Payer: Self-pay | Admitting: Obstetrics and Gynecology

## 2020-01-17 ENCOUNTER — Ambulatory Visit (INDEPENDENT_AMBULATORY_CARE_PROVIDER_SITE_OTHER): Payer: Self-pay | Admitting: Obstetrics and Gynecology

## 2020-01-17 DIAGNOSIS — O020 Blighted ovum and nonhydatidiform mole: Secondary | ICD-10-CM

## 2020-01-17 DIAGNOSIS — Z3042 Encounter for surveillance of injectable contraceptive: Secondary | ICD-10-CM

## 2020-01-17 MED ORDER — MEDROXYPROGESTERONE ACETATE 150 MG/ML IM SUSP
150.0000 mg | Freq: Once | INTRAMUSCULAR | Status: AC
  Administered 2020-01-17: 150 mg via INTRAMUSCULAR

## 2020-01-17 NOTE — Progress Notes (Addendum)
Colleen Thornton is here for follow up of her complete molar pregnancy. D & C on 12/15/19 Last BHCG  01/06/20 1 LMP 01/07/20 Plans to get IUD with PCP. But waiting on financial aid. Denies any bowel or bladder dysfunction  PE AF VSS Lungs clear Heart RRR Abd soft + BS  A/P Complete molar pregnancy        Contraception  Dx reviewed with pt. Information provided to pt. Pt desires Depo Provera until IUD placement. Will check BHCG today and next week. If remains indictable monthly x 3. Live interrupter used during today's visit

## 2020-01-17 NOTE — Addendum Note (Signed)
Addended by: Hermina Staggers on: 01/17/2020 08:47 AM   Modules accepted: Orders

## 2020-01-17 NOTE — Patient Instructions (Signed)
Embarazo molar Molar Pregnancy  Un embarazo molar (mola hidatiforme) es una masa de tejido que crece en el tero despus de una concepcin incorrecta. Es Chartered loss adjuster anormal y no llega a ser un feto. En general, el embarazo termina a causa de un aborto espontneo. En algunos casos, posiblemente se requiera tratamiento. Cules son las causas? Esta afeccin se da cuando un vulo es fecundado incorrectamente, de modo que tiene material gentico anormal (cromosomas). Esto puede generar uno de dos tipos de [place] molar:  Embarazo molar completo. Se presenta cuando todos los cromosomas del vulo fecundado son del padre, y ninguno de Chief Strategy Officer.  Embarazo molar parcial. El vulo fecundado tiene cromosomas del padre y de la [place] estos son demasiados. Qu incrementa el riesgo? Es ms probable que esta afeccin se manifieste en:  Mujeres de ms de 35aos o de menos de [address]  Mujeres que tuvieron Chartered loss adjuster molar en el pasado (muy poco frecuente). Entre otros factores de riesgo posibles se incluyen los siguientes:  Fumar ms de 15cigarrillos por Futures trader.  Antecedentes de infertilidad.  Tener tipo de sangre A, B o AB.  Tener falta ( dficit) de vitamina A.  Tomar anticonceptivos por va oral. Cules son los signos o los sntomas? Los sntomas de esta afeccin incluyen los siguientes:  Hemorragia vaginal.  Falta del periodo menstrual.  tero que crece ms rpido que lo previsto en un embarazo normal.  Nuseas y vmitos intensos.  Presin o Financial risk analyst.  Quistes ovricos anormales (quistes tecalutenicos).  Secrecin vaginal similar a uvas.  Presin arterial alta (inicio temprano de preeclampsia).  Hiperactividad tiroidea (hipertiroidismo).  No tener suficientes glbulos rojos o hemoglobina (anemia). Cmo se diagnostica? [place] molar se diagnostica con Sherlyn Lees y con [place]. Cmo se trata? En general, los embarazos molares  finalizan por un aborto espontneo. El mdico puede controlar esta afeccin de la siguiente manera:  Supervisar los niveles de hormonas del embarazo en la sangre, para verificar que disminuyan segn lo previsto.  Administrar un medicamento llamado inmunoglobulina Rho (D). Este medicamento ayuda a prevenir problemas que pueden ocurrir en futuros embarazos como resultado de una protena presente en los glbulos rojos (factor Rh). Pueden administrarle este medicamento si no tiene un factor Rh (usted es Rh negativo) y su pareja tiene ese factor (Rh positivo).  Someterla a quimioterapia. Implica tomar medicamentos que regulan los niveles de las hormonas del [place]. Se recomienda cuando los niveles de estas hormonas no disminuyen segn lo previsto.  Realizar un procedimiento llamado dilatacin y curetaje o curetaje por aspiracin. Se trata de procedimientos menores que implican raspar o Scientist, research (life sciences) molar para extraerlo del tero por la vagina. Incluso si un embarazo molar concluye por s solo, se puede realizar alguno de estos procedimientos para extraer todo el tejido anormal del tero.  Realizar una extraccin quirrgica del tero (histerectoma). Siga estas indicaciones en su casa:  Evite quedar embarazada durante 6 a o segn las indicaciones del mdico. Para evitar un embarazo, no tenga relaciones sexuales o use algn mtodo anticonceptivo confiable cada vez que las tenga.  [employer] medicamentos de venta libre y los recetados solamente como se lo haya indicado el mdico.  Haga reposo segn necesite y retome gradualmente sus actividades habituales.  Considere participar en un grupo de apoyo. Si siente mucha tristeza, pdale ayuda al mdico.  Concurra a todas las visitas de seguimiento como se lo haya indicado el mdico. Esto es importante. Es posible que  deban realizarle anlisis de sangre o ecografas de seguimiento. Comunquese con un mdico si:  Contina teniendo  hemorragias vaginales irregulares.  Siente dolor abdominal. Resumen  Un embarazo molar (mola hidatiforme) es una masa de tejido que crece en el tero despus de una concepcin incorrecta.  Esta afeccin es ms frecuente en mujeres de ms de 35aos o menos de 20 aos, o en las que ya han tenido un embarazo molar en el pasado.  El sntoma ms frecuente es la hemorragia vaginal.  En general, el embarazo molar concluye con un aborto espontneo, y no se requiere tratamiento. Esta informacin no tiene como fin reemplazar el consejo del mdico. Asegrese de hacerle al mdico cualquier pregunta que tenga. Document Revised: 01/20/2017 Document Reviewed: 01/20/2017 Elsevier Patient Education  2020 Elsevier Inc.  

## 2020-01-18 LAB — BETA HCG QUANT (REF LAB): hCG Quant: <1 m[IU]/mL

## 2020-01-24 ENCOUNTER — Other Ambulatory Visit: Payer: Self-pay

## 2020-01-24 DIAGNOSIS — O020 Blighted ovum and nonhydatidiform mole: Secondary | ICD-10-CM

## 2020-01-25 LAB — BETA HCG QUANT (REF LAB): hCG Quant: <1 m[IU]/mL

## 2020-01-26 ENCOUNTER — Other Ambulatory Visit: Payer: Self-pay

## 2020-01-26 ENCOUNTER — Other Ambulatory Visit: Payer: Self-pay | Admitting: Obstetrics and Gynecology

## 2020-01-26 DIAGNOSIS — O020 Blighted ovum and nonhydatidiform mole: Secondary | ICD-10-CM

## 2020-01-31 NOTE — Telephone Encounter (Signed)
Received mirena IUD for patient from [employer] Patient Assistance program. Patient scheduled for IUD placement on 10/7 in [facility].   Veronda Prude, RN

## 2020-02-02 NOTE — Telephone Encounter (Signed)
Attempted to reach pt through interpreter Nicole Cella # [phone]. Interpreter LVM for pt to cal the office to make an appt to be seen for future refills. Aquilla Solian, CMA

## 2020-02-02 NOTE — Telephone Encounter (Signed)
Great, thanks for letting me know 

## 2020-02-02 NOTE — Telephone Encounter (Signed)
Good morning, sorry for the late response. I do not see this on patient's med list or previous note. Patient needs to be seen by a provider prior to prescription.

## 2020-02-10 ENCOUNTER — Other Ambulatory Visit: Payer: Self-pay

## 2020-02-10 ENCOUNTER — Ambulatory Visit (INDEPENDENT_AMBULATORY_CARE_PROVIDER_SITE_OTHER): Payer: Self-pay | Admitting: Family Medicine

## 2020-02-10 VITALS — BP 100/80 | HR 68 | Wt 141.0 lb

## 2020-02-10 DIAGNOSIS — Z30430 Encounter for insertion of intrauterine contraceptive device: Secondary | ICD-10-CM

## 2020-02-10 DIAGNOSIS — Z3009 Encounter for other general counseling and advice on contraception: Secondary | ICD-10-CM

## 2020-02-10 LAB — POCT URINE PREGNANCY: Preg Test, Ur: NEGATIVE

## 2020-02-10 NOTE — Progress Notes (Signed)
PMH: Hx molar pregnancy complete with D and C 12/15/2019 Has had follow up with OBGYN and they are aware of her plans to have IUD placed. [id number]  IUD INSERTION: Patient given informed consent, signed copy in the chart..  Negative pregnancy confirmed.  Appropriate time out taken.   Sterile instruments and technique was used. Cervix brought into view with use of speculum and then cleansed three times with  betadine swabs.  A tenaculum was placed into the anterior lip of the cervix and a uterine sound was used to measure uterine size.   A Mirena  IUD was placed into the endometrial cavity, deployed and secured. The applicator was removed. The strings were trimmed to 2 centimeters.   There were no complications and the patient tolerated the procedure well.   She was given handouts for post procedure instructions and information about the IUD including a card with the time of recommended removal. She was reminded that the IUD does not protect against sexually transmitted diseases.

## 2020-02-10 NOTE — Patient Instructions (Signed)
  Colocacin de un dispositivo intrauterino, cuidados posteriores Intrauterine Device Insertion, Care After  Lea esta informacin sobre cmo cuidarse despus del procedimiento. El mdico tambin podr darle instrucciones ms especficas. Comunquese con su mdico si tiene problemas o preguntas. Qu puedo esperar despus del procedimiento? Despus del procedimiento, es comn [employer] siguientes sntomas:  Dolor y clicos abdominales.  Sangrado leve (manchado) o sangrado ms abundante, similar al perodo menstrual. Esto puede durar [employer].  Dolor en la parte inferior de la espalda.  Mareos.  Dolores de [place].  Nuseas. Siga estas indicaciones en su casa:  Antes de [employer] sexual nuevamente, revise la zona y asegrese de poder tocar los hilos del DIU. Debe poder tocar el extremo de los hilos por debajo de la abertura del cuello uterino. Si el hilo del DIU est en su lugar, puede retomar la actividad sexual. ? Si le colocaron un DIU hormonal despus de que hayan pasado 7das del inicio del perodo menstrual ms reciente, Sports coach un mtodo anticonceptivo adicional durante los 7das posteriores a Education officer, community del DIU. Pregntele al mdico si esto es vlido para su caso.  Siga controlando que el DIU est en su lugar; para ello, toque los hilos despus de cada perodo menstrual o una vez al mes.  Tome los medicamentos de venta libre y los recetados solamente como se lo haya indicado el mdico.  No conduzca ni use maquinaria pesada mientras toma analgsicos recetados.  Concurra a todas las visitas de control como se lo haya indicado el mdico. Esto es importante. Comunquese con un mdico si:  Tiene un sangrado ms abundante o dura ms de un ciclo menstrual normal.  Tiene fiebre.  Tiene clicos o un dolor abdominal que empeora o que no mejora con los medicamentos.  Siente un dolor abdominal nuevo o que no est en la misma zona en la que sinti antes los  clicos y Chief Technology Officer.  Se siente mareada o dbil.  Le sale una secrecin anormal o con mal olor de la vagina.  Siente dolor durante la actividad sexual.  Tiene cualquiera de los siguientes problemas con los hilos del DIU: ? El hilo le [place] o lo lastima a usted o a su pareja sexual. ? No puede tocar el hilo. ? El hilo se ha alargado.  Puede sentir el DIU en la vagina.  Cree que puede estar embarazada o no tiene su perodo menstrual.  Cree que puede tener una ITS (infeccin de transmisin sexual). Solicite ayuda de inmediato si:  Tiene sntomas similares a los de Emergency planning/management officer.  Siente escalofros o tiene fiebre.  Puede sentir que el DIU se ha salido de Environmental consultant. Resumen  Despus del procedimiento, es comn Surveyor, mining y clicos en el abdomen. Tambin es comn tener un sangrado leve (manchado) o un sangrado ms abundante, similar al perodo menstrual.  Siga controlando que el DIU est en su lugar; para ello, toque los hilos despus de cada perodo menstrual o una vez al mes.  Concurra a todas las visitas de control como se lo haya indicado el mdico. Esto es importante.  Comunquese con el mdico si tiene problemas con los hilos del DIU, por ejemplo, si el hilo se alarga o si le resulta molesto a usted o a su pareja sexual durante la actividad sexual. Esta informacin no tiene Theme park manager el consejo del mdico. Asegrese de hacerle al mdico cualquier pregunta que tenga. Document Revised: 01/23/2017 Document Reviewed: 01/23/2017 Elsevier Patient Education  2020 [employer].

## 2020-02-14 MED ORDER — LEVONORGESTREL 20 MCG/24HR IU IUD
1.0000 | INTRAUTERINE_SYSTEM | Freq: Once | INTRAUTERINE | Status: AC
Start: 2020-02-14 — End: 2020-02-10
  Administered 2020-02-10: 1 via INTRAUTERINE

## 2020-02-14 NOTE — Addendum Note (Signed)
Addended by: Veronda Prude on: 02/14/2020 12:39 PM   Modules accepted: Orders

## 2020-02-15 NOTE — Telephone Encounter (Signed)
Attempted to reach pt through interpreter Marquita Palms [phone]. No answer LVM for pt to call the office to make an appt to be seen and for any future refills. Aquilla Solian, CMA

## 2020-02-24 ENCOUNTER — Other Ambulatory Visit: Payer: Self-pay

## 2020-02-24 DIAGNOSIS — O020 Blighted ovum and nonhydatidiform mole: Secondary | ICD-10-CM

## 2020-02-25 LAB — BETA HCG QUANT (REF LAB): hCG Quant: <1 m[IU]/mL

## 2020-02-29 NOTE — Progress Notes (Signed)
Patient has an appt scheduled on 03/01/20 in which she will be advised of the fu.    Addison Naegeli, RN

## 2020-03-01 ENCOUNTER — Ambulatory Visit (INDEPENDENT_AMBULATORY_CARE_PROVIDER_SITE_OTHER): Payer: Self-pay | Admitting: Obstetrics and Gynecology

## 2020-03-01 ENCOUNTER — Other Ambulatory Visit: Payer: Self-pay

## 2020-03-01 ENCOUNTER — Encounter: Payer: Self-pay | Admitting: Obstetrics and Gynecology

## 2020-03-01 DIAGNOSIS — N644 Mastodynia: Secondary | ICD-10-CM

## 2020-03-01 DIAGNOSIS — Z975 Presence of (intrauterine) contraceptive device: Secondary | ICD-10-CM | POA: Insufficient documentation

## 2020-03-01 NOTE — Progress Notes (Signed)
Ms Colleen Thornton presents with a c/o bilateral breast pain for several months now. First noted more with exersize and movement. She now has pain at other times but is unable to be specific. She denies any new medications except for recent IUD placement.  She denies any excessive caffeine use, injury, trauma, nipple discharge or breast masses. FH negative.  PE AF VSS Lungs clear Heart RRR Breast sym supple no nipple discharge masses or adenopathy.  A/P Breast Pain Causes of breast pain reviewed with pt. Low concern for breast cancer by history and exam. Discussed proper fitting bra and Vitamin E use with pt. Pt instructed to call with any change in her Sx or if Sx do not improve. Information sheet on breast pain provided to pt as well. F/U PRN Live interrupter used during today's visit

## 2020-03-01 NOTE — Progress Notes (Signed)
Spanish Interpreter Eda R. 

## 2020-03-01 NOTE — Patient Instructions (Signed)
Dolor a la palpacin de las mamas Breast Tenderness El dolor a la palpacin de las mamas es un problema frecuente en las mujeres de todas las edades, pero tambin puede ocurrir en los hombres. El dolor a la palpacin de las mamas puede oscilar desde molestias leves hasta un dolor intenso. En las mujeres, el dolor generalmente es intermitente y se relaciona con el ciclo menstrual, pero puede ser constante. Son muchas las causas posibles del dolor a la palpacin de las mamas, incluidos los cambios hormonales, infecciones y el uso de ciertos medicamentos. Puede someterse a pruebas como una mamografa o una ecografa, para detectar hallazgos inusuales. Por lo general, el dolor a la palpacin de las mamas no significa que usted tenga cncer de mama. Siga estas instrucciones en su casa: Control del dolor y de las molestias   Si se lo indican, aplique hielo sobre la zona dolorida. Para hacer esto: ? Ponga el hielo en una bolsa plstica. ? Coloque una toalla entre la piel y la bolsa. ? Coloque el hielo durante 20minutos, 2 a 3veces por da.  Use un sostn de soporte, especialmente al hacer actividad fsica. Quizs tambin desee usar ese sostn para dormir, si siente las mamas muy sensibles. Medicamentos  Use los medicamentos de venta libre y los recetados solamente como se lo haya indicado el mdico. Si el dolor es causado por alguna infeccin, posiblemente le receten un antibitico.  Si le recetaron un antibitico, tmelo o selo como se lo haya indicado el mdico. No deje de tomar los antibiticos aunque comience a sentirse mejor. Comida y bebida  El mdico puede recomendarle disminuir el consumo de grasa en su dieta. Puede hacer lo siguiente: ? Limite el consumo de alimentos fritos. ? A la hora de cocinarlos, opte por hornearlos, hervirlos, grillarlos y asarlos a la parrilla.  Disminuya la cantidad de cafena de su dieta. En cambio, beba ms agua y elija bebidas sin cafena. Instrucciones  generales   Lleve un registro de los das y las horas cuando tiene mayor sensibilidad en las mamas.  Pregntele al mdico cmo debe hacerse los exmenes de mamas en su casa. Esto la ayudar a notar si tiene algn crecimiento o bulto fuera de lo normal.  Concurra a todas las visitas de seguimiento como se lo haya indicado el mdico. Esto es importante. Comunquese con un mdico si:  Cualquier zona de la mama est dura, enrojecida y caliente al tacto. Puede ser un signo de infeccin.  Es mujer y: ? No est en etapa de lactancia y le sale lquido de los pezones, especialmente sangre o pus. ? Tiene un bulto nuevo o doloroso en la mama que no desaparece despus de la finalizacin del perodo menstrual.  Tiene fiebre.  El dolor no mejora o empeora.  El dolor le impide realizar las actividades cotidianas. Resumen  El dolor a la palpacin de las mamas puede oscilar desde molestias leves hasta un dolor intenso.  Son muchas las causas posibles del dolor a la palpacin de las mamas, incluidos los cambios hormonales, infecciones y el uso de ciertos medicamentos.  Puede tratarse con hielo, el uso un sostn de soporte y medicamentos.  Haga cambios en la dieta como se lo haya indicado el mdico. Esta informacin no tiene como fin reemplazar el consejo del mdico. Asegrese de hacerle al mdico cualquier pregunta que tenga. Document Revised: 10/28/2018 Document Reviewed: 10/28/2018 Elsevier Patient Education  2020 Elsevier Inc.  

## 2020-03-15 ENCOUNTER — Other Ambulatory Visit: Payer: Self-pay

## 2020-03-15 DIAGNOSIS — O020 Blighted ovum and nonhydatidiform mole: Secondary | ICD-10-CM

## 2020-03-15 NOTE — Addendum Note (Signed)
Addended by: Maxwell Marion E on: 03/15/2020 11:34 AM   Modules accepted: Orders

## 2020-03-23 ENCOUNTER — Other Ambulatory Visit: Payer: Self-pay

## 2020-03-23 DIAGNOSIS — O020 Blighted ovum and nonhydatidiform mole: Secondary | ICD-10-CM

## 2020-03-23 LAB — BETA HCG QUANT (REF LAB): hCG Quant: <1 m[IU]/mL

## 2020-03-27 ENCOUNTER — Telehealth: Payer: Self-pay | Admitting: *Deleted

## 2020-03-27 NOTE — Telephone Encounter (Signed)
I called Renna with [employer] [phone] and we left a message for Marquasha we are calling with nonurgent information from her provider; please call office for details. Sent message to registrar to schedule one month lab only for non stat bhcg. Lusine Corlett,RN

## 2020-03-27 NOTE — Telephone Encounter (Signed)
-----   Message from Hermina Staggers, MD sent at 03/25/2020  9:24 AM EST ----- Please have pt return in 1 month for non stat BHCG level. Pt is aware.  Thanks Casimiro Needle

## 2020-03-28 NOTE — Telephone Encounter (Signed)
Called pt with interpreter Colleen Thornton. Pt given beta HCG results. Pt reports having already seen new lab appt via MyChart.

## 2020-04-18 ENCOUNTER — Other Ambulatory Visit: Payer: Self-pay | Admitting: *Deleted

## 2020-04-18 DIAGNOSIS — O020 Blighted ovum and nonhydatidiform mole: Secondary | ICD-10-CM

## 2020-04-20 ENCOUNTER — Other Ambulatory Visit: Payer: Self-pay

## 2020-04-20 DIAGNOSIS — O020 Blighted ovum and nonhydatidiform mole: Secondary | ICD-10-CM

## 2020-04-21 LAB — BETA HCG QUANT (REF LAB): hCG Quant: <1 m[IU]/mL

## 2021-06-15 ENCOUNTER — Ambulatory Visit: Payer: Self-pay | Admitting: Family Medicine

## 2021-06-29 IMAGING — US US OB < 14 WEEKS - US OB TV
1 series · 15 of 28 positions shown · non-contrast
Comparison: None.

CLINICAL DATA: Vaginal bleeding and cramping. First trimester
pregnancy.

EXAM:
OBSTETRIC <14 WK US AND TRANSVAGINAL OB US
TECHNIQUE: Both transabdominal and transvaginal ultrasound examinations were
performed for complete evaluation of the gestation as well as the
maternal uterus, adnexal regions, and pelvic cul-de-sac.
Transvaginal technique was performed to assess early pregnancy.

[Series 1: us ob < 14 weeks - us ob tv · 101 acquisitions, 15 frames shown]
[im 1/101]
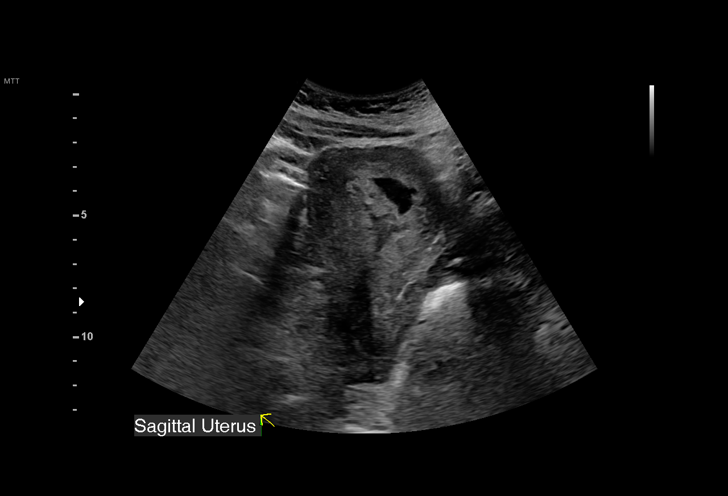
[im 8/101]
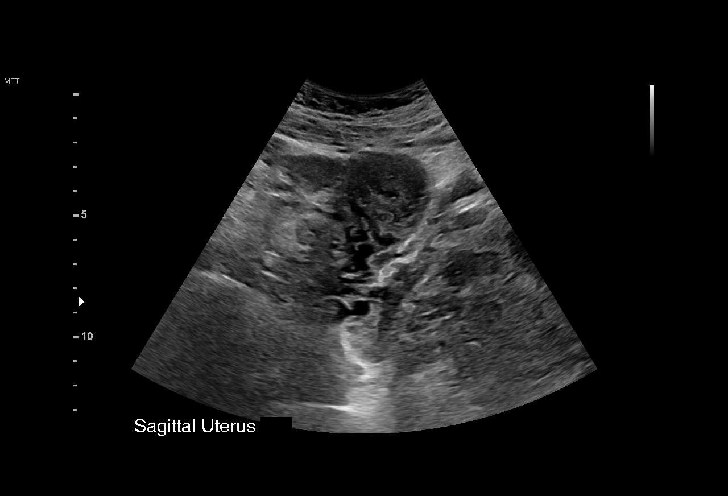
[im 15/101]
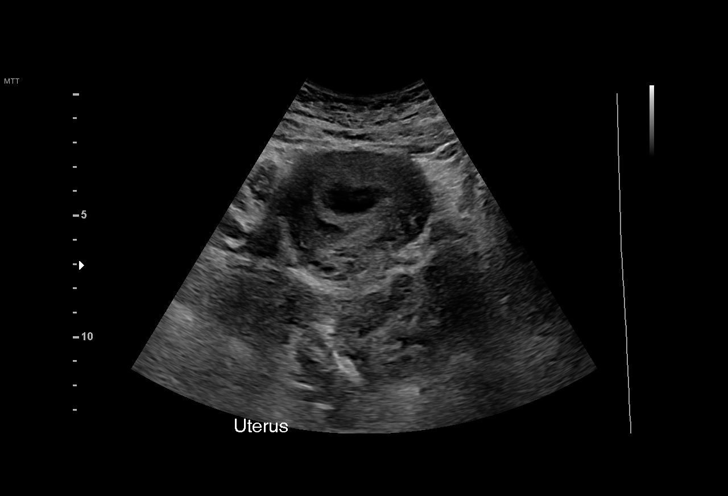
[im 23/101]
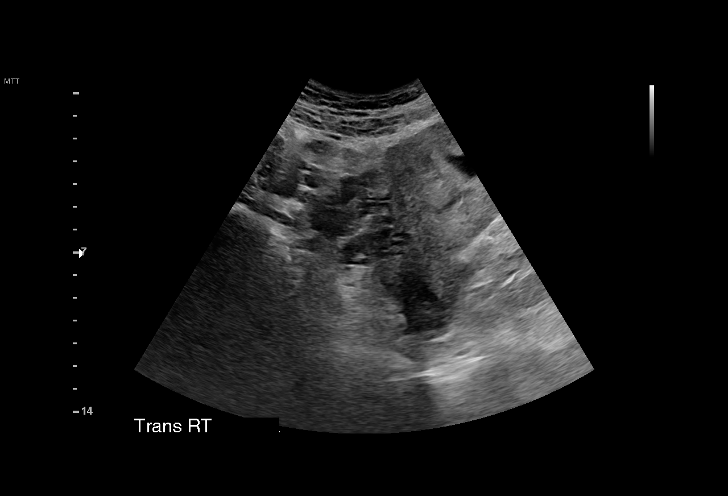
[im 30/101]
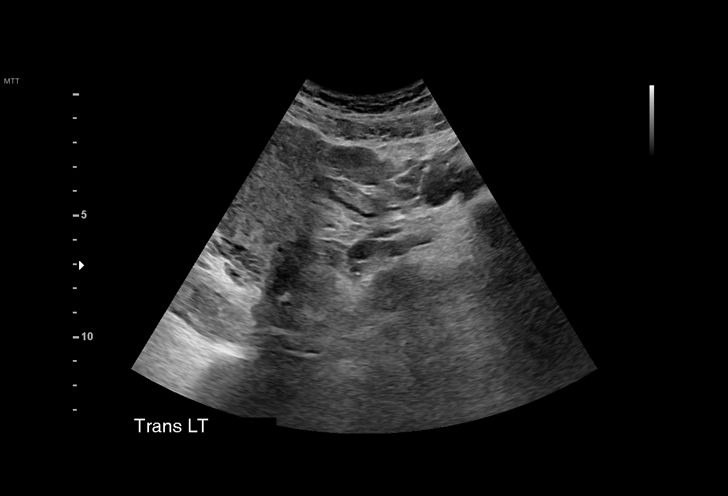
[im 38/101]
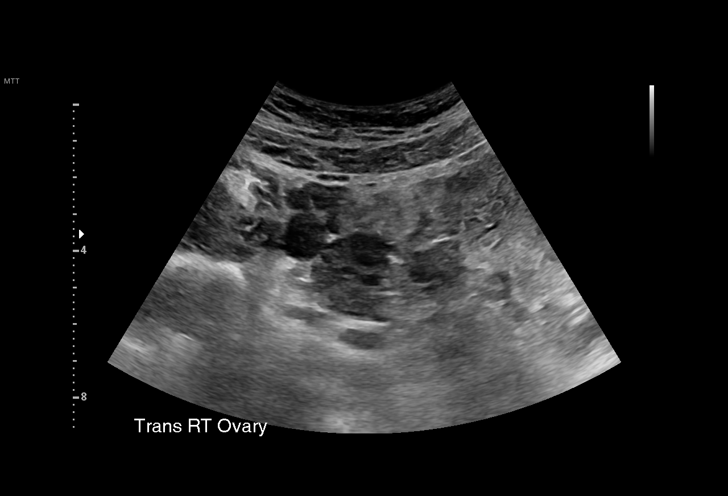
[im 45/101]
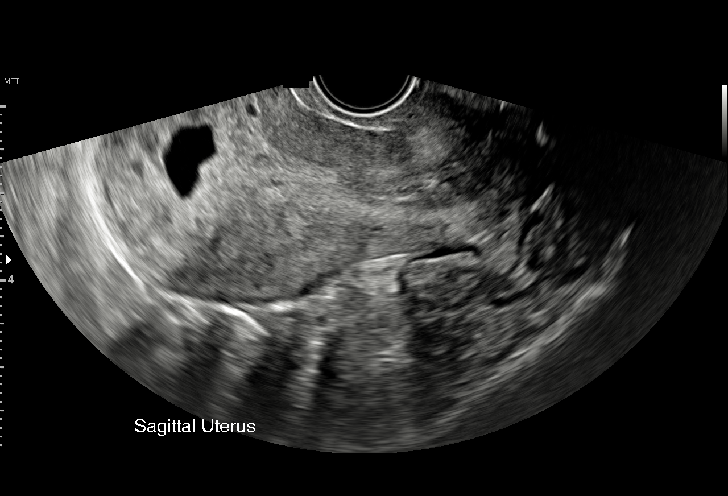
[im 52/101]
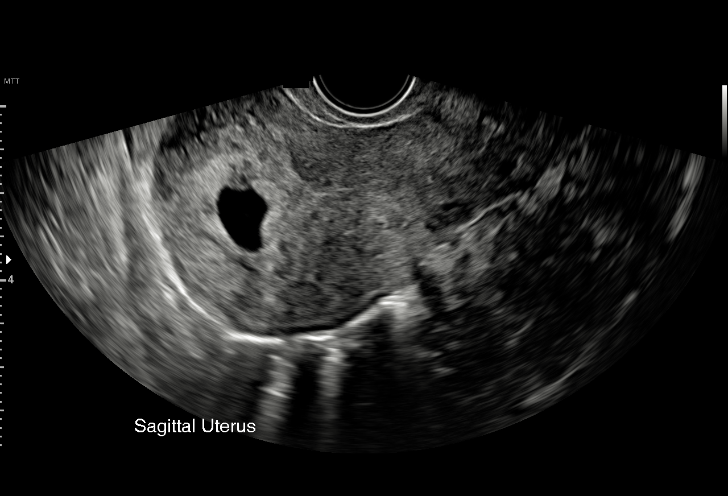
[im 56/101]
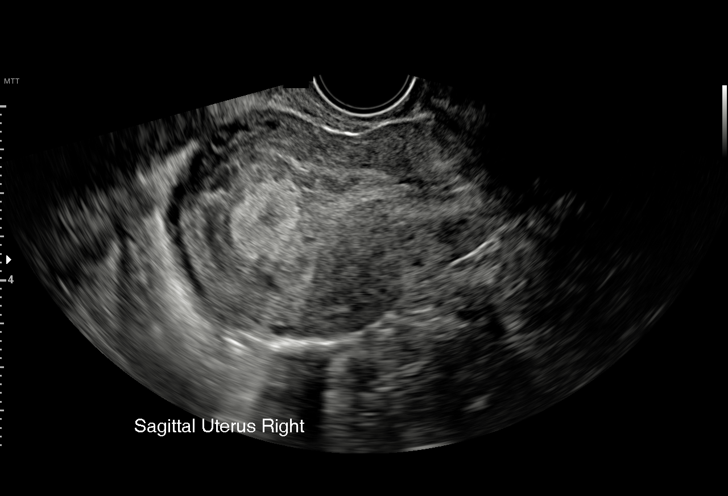
[im 63/101]
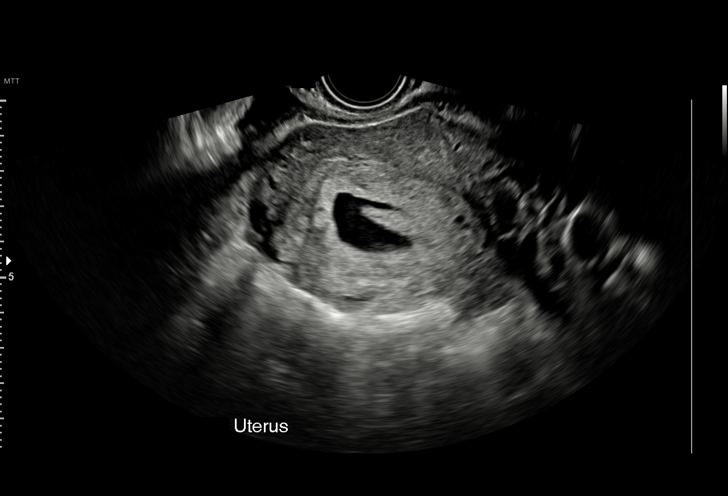
[im 71/101]
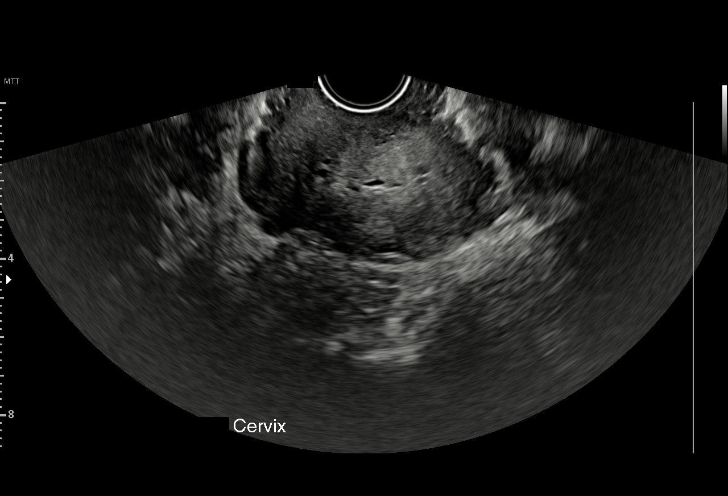
[im 78/101]
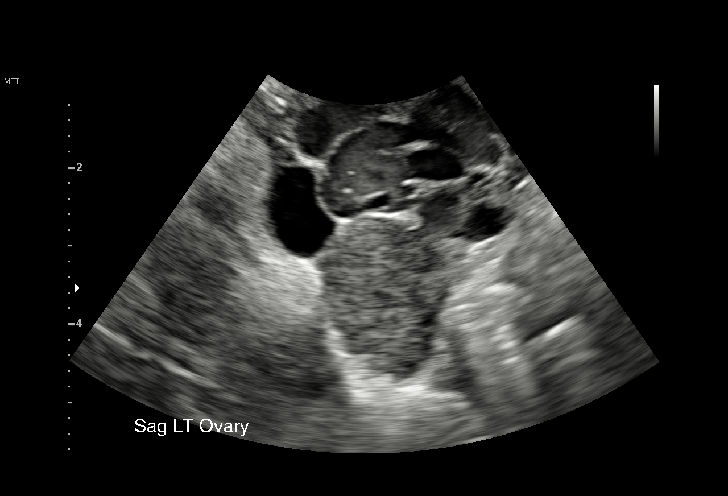
[im 86/101]
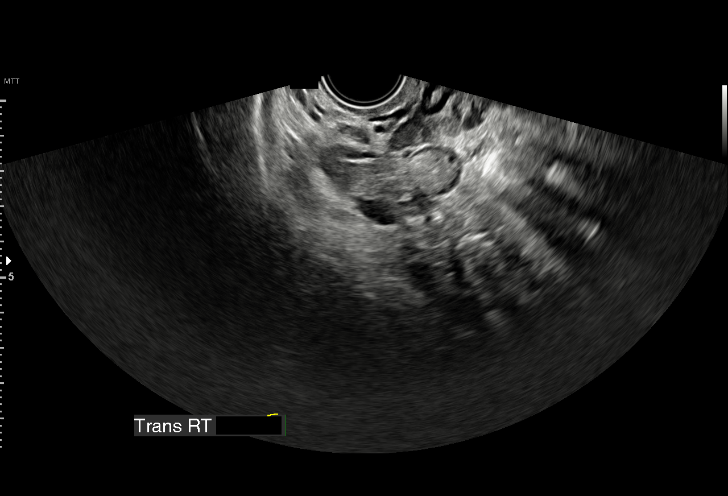
[im 93/101]
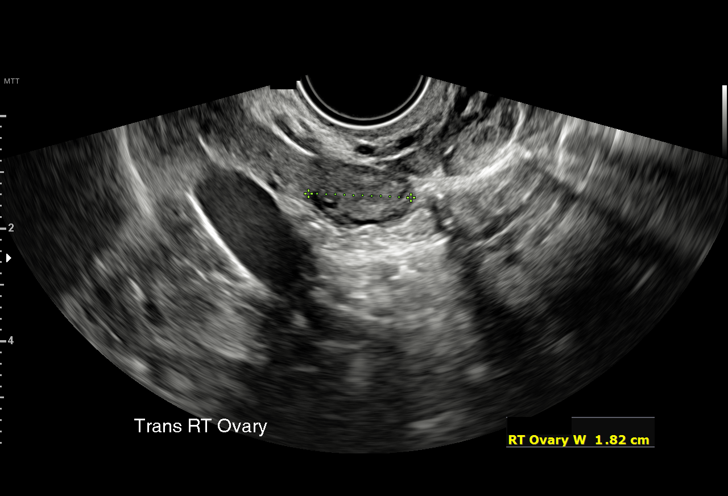
[im 101/101]
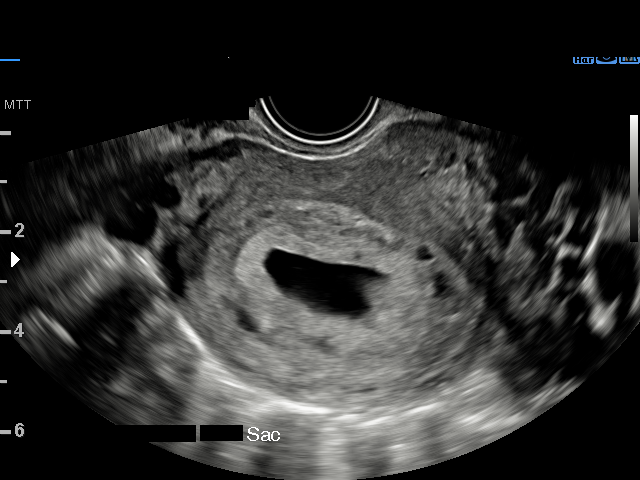

[15 of 28 positions shown; findings below may reference images not displayed]

FINDINGS: Intrauterine gestational sac: Single; mildly irregular shape and
thin choroid decidual reaction noted

Yolk sac:  Not Visualized.

Embryo:  Not Visualized.

MSD: 19 mm   6 w   5 d

Subchorionic hemorrhage:  None visualized.

Maternal uterus/adnexae: Both ovaries are normal in appearance. No
mass or abnormal free fluid identified.
IMPRESSION: Findings are suspicious but not yet definitive for failed pregnancy.
Recommend follow-up US in 10-14 days for definitive diagnosis. This
recommendation follows SRU consensus guidelines: Diagnostic Criteria
for Nonviable Pregnancy Early in the First Trimester. N Engl J Med

## 2021-07-11 IMAGING — US US OB TRANSVAGINAL
1 series · 15 of 28 positions shown · non-contrast
Comparison: 11/25/2019 and 11/19/2019

CLINICAL DATA: Vaginal bleeding for approximately 3 weeks. First
trimester pregnancy with inconclusive fetal viability.

EXAM:
TRANSVAGINAL OB ULTRASOUND
TECHNIQUE: Transvaginal ultrasound was performed for complete evaluation of the
gestation as well as the maternal uterus, adnexal regions, and
pelvic cul-de-sac.

[Series 1: us ob transvaginal · 47 acquisitions, 15 frames shown]
[im 1/47]
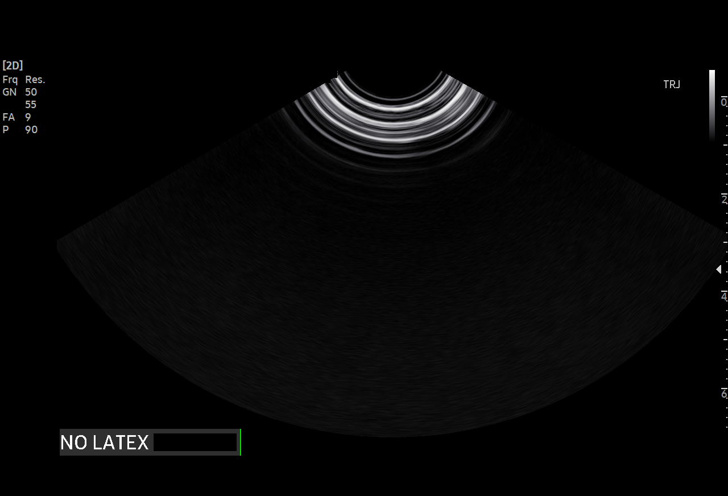
[im 4/47]
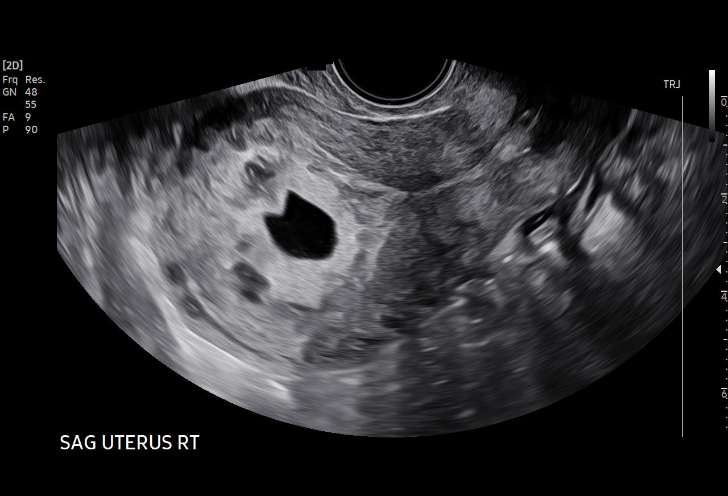
[im 7/47]
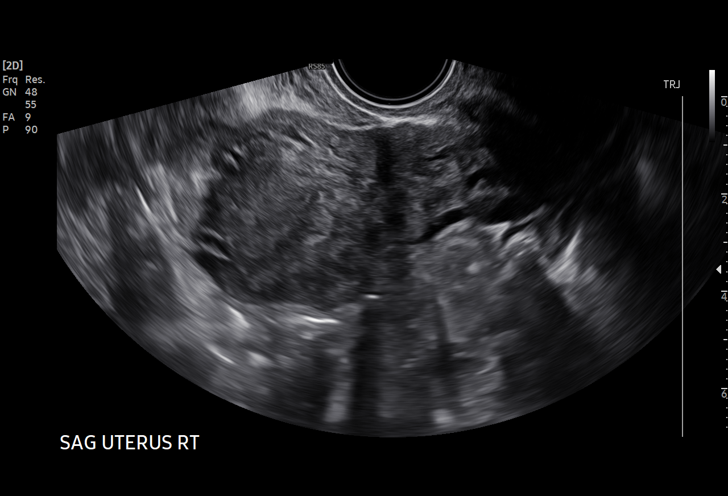
[im 11/47]
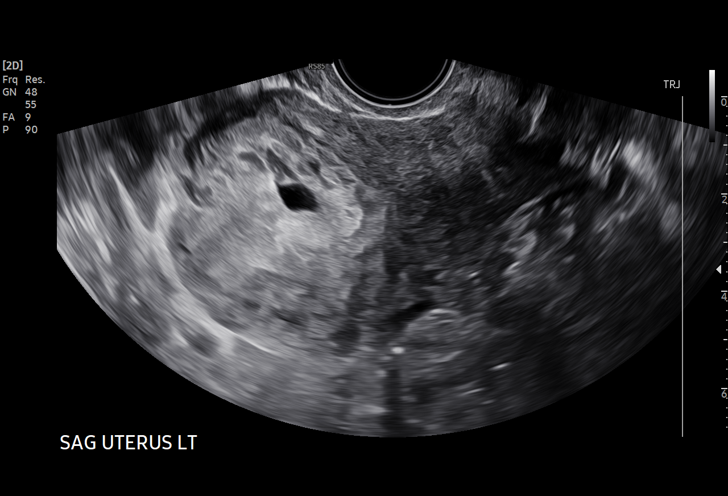
[im 14/47]
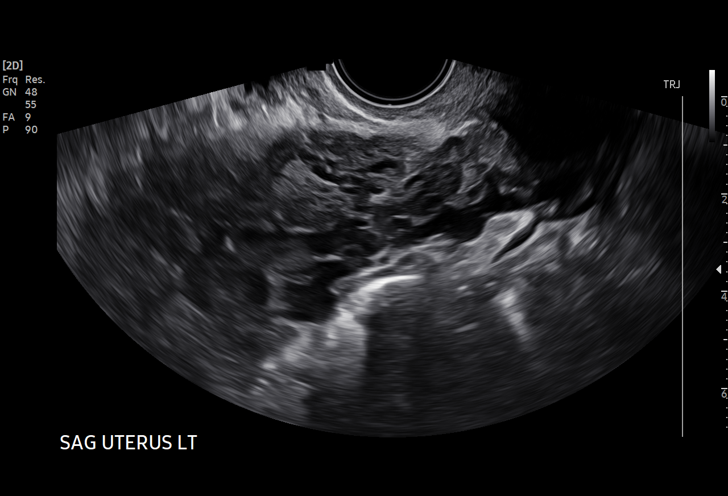
[im 18/47]
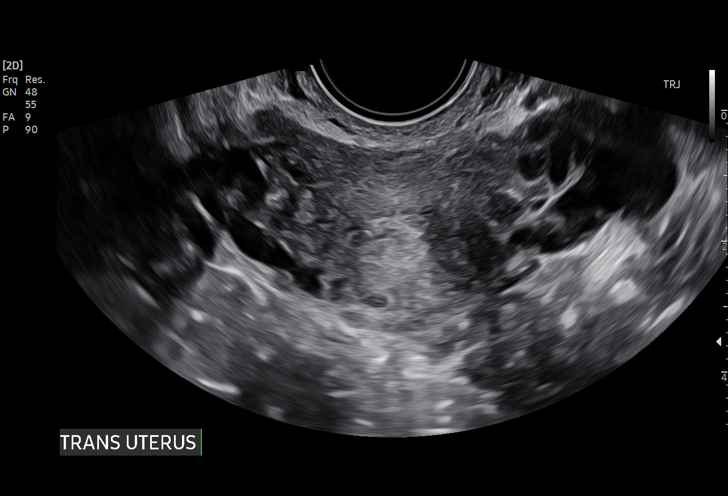
[im 21/47]
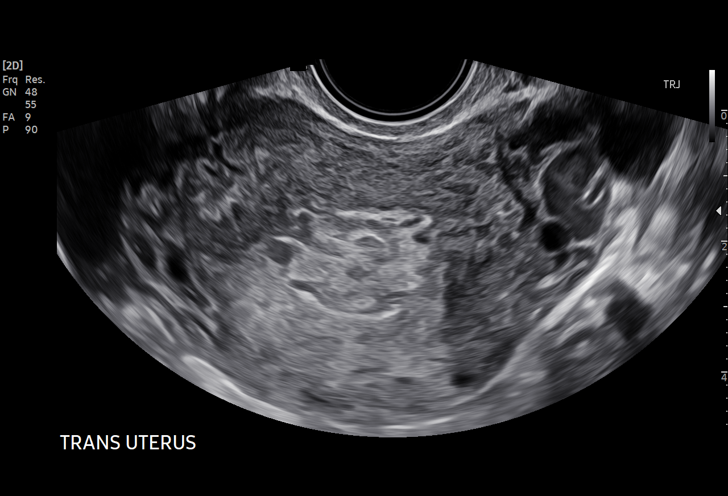
[im 24/47]
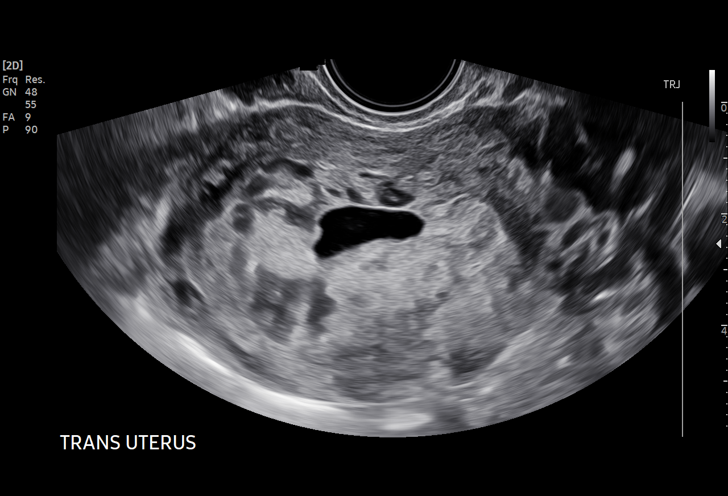
[im 26/47]
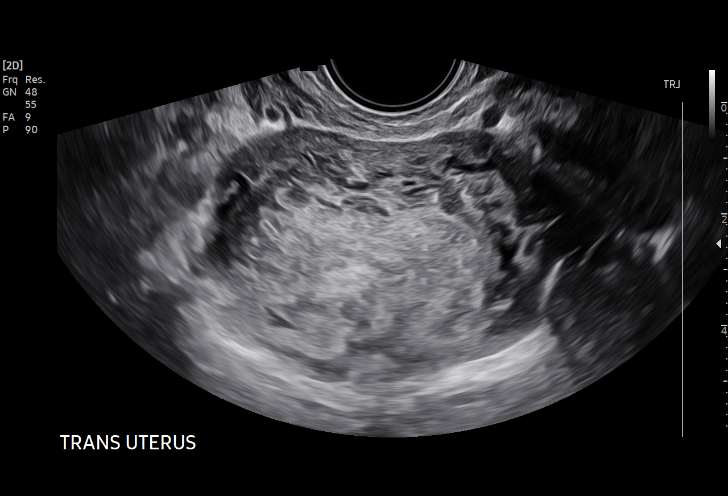
[im 29/47]
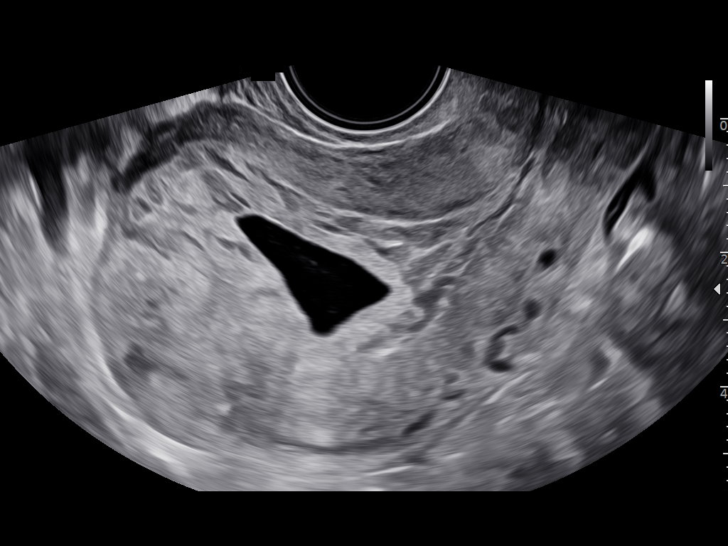
[im 33/47]
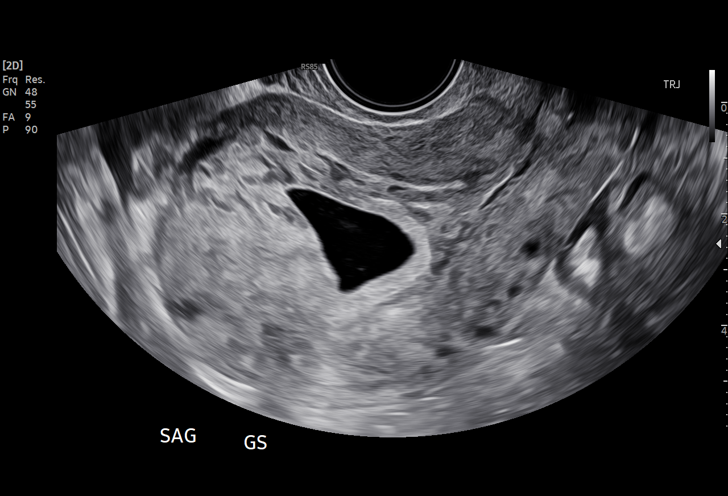
[im 36/47]
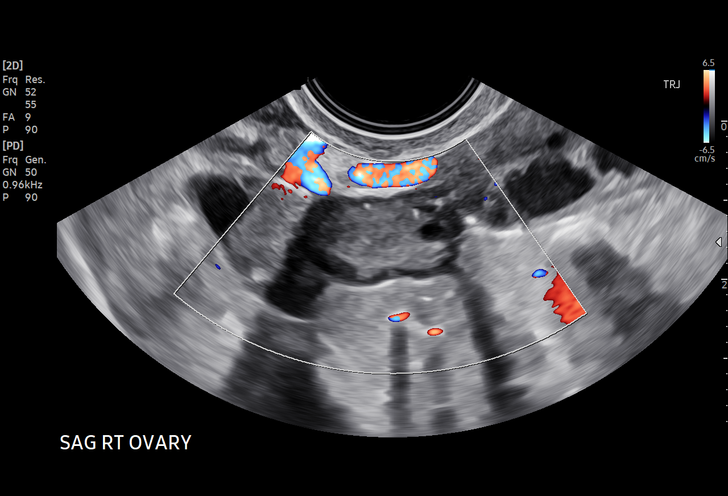
[im 40/47]
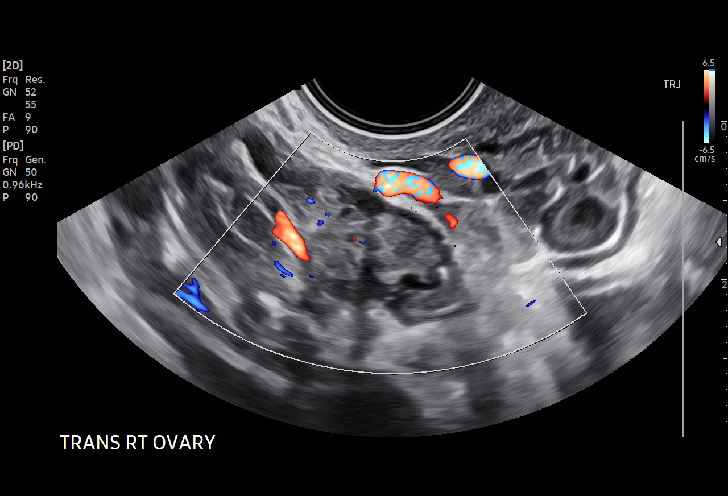
[im 43/47]
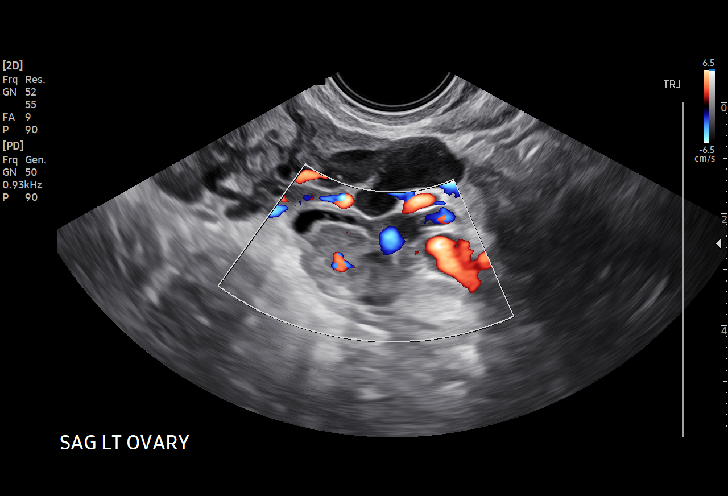
[im 47/47]
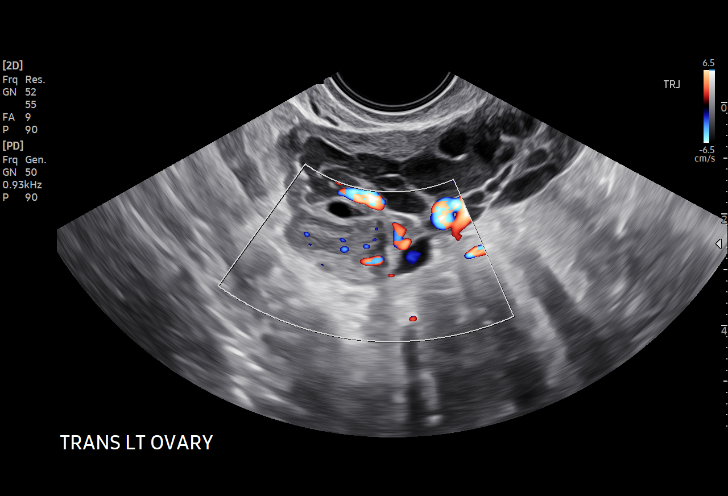

[15 of 28 positions shown; findings below may reference images not displayed]

FINDINGS: Intrauterine gestational sac: Single; irregular sac shape again
noted

Yolk sac:  Not Visualized.

Embryo:  Not Visualized.

MSD: 21 mm   7 w   0 d

Subchorionic hemorrhage:  None visualized.

Maternal uterus/adnexae: Both ovaries are normal appearance. No mass
or abnormal free fluid identified.
IMPRESSION: Single empty intrauterine gestational sac shows no significant
growth compared to previous studies. Findings are highly suspicious
but not yet definitive for failed pregnancy. Continued follow-up
ultrasound is recommended in 3-4 days for definitive diagnosis (> 14
days from original ultrasound on 11/19/2019). This recommendation
follows SRU consensus guidelines: Diagnostic Criteria for Nonviable
Pregnancy Early in the First Trimester. N Engl J Med 7127;

## 2022-09-10 ENCOUNTER — Telehealth: Payer: Self-pay

## 2022-09-17 NOTE — Transitions of Care (Post Inpatient/ED Visit) (Signed)
   09/17/2022  Name: Colleen Thornton MRN: [id number] DOB: Feb 06, 1986 error Today's TOC FU Call Status:    Attempted to reach the patient regarding the most recent Inpatient/ED visit.  Follow Up Plan: No further outreach attempts will be made at this time. We have been unable to contact the patient.  Signature Karena Addison, LPN [facility] Nurse Health Advisor Direct Dial [phone]
